# Patient Record
Sex: Female | Born: 1950 | Race: Black or African American | Hispanic: No | Smoking: Never smoker
Health system: Southern US, Community
[De-identification: ages and names within clinical notes are randomized; demographics above are authoritative.]

## PROBLEM LIST (undated history)

## (undated) DIAGNOSIS — I10 Essential (primary) hypertension: Secondary | ICD-10-CM

## (undated) DIAGNOSIS — J45909 Unspecified asthma, uncomplicated: Secondary | ICD-10-CM

## (undated) DIAGNOSIS — E119 Type 2 diabetes mellitus without complications: Secondary | ICD-10-CM

## (undated) DIAGNOSIS — E039 Hypothyroidism, unspecified: Secondary | ICD-10-CM

## (undated) DIAGNOSIS — E785 Hyperlipidemia, unspecified: Secondary | ICD-10-CM

## (undated) DIAGNOSIS — R0600 Dyspnea, unspecified: Secondary | ICD-10-CM

## (undated) HISTORY — PX: TONSILLECTOMY: SUR1361

## (undated) HISTORY — DX: Hypothyroidism, unspecified: E03.9

## (undated) HISTORY — PX: EYE SURGERY: SHX253

## (undated) HISTORY — DX: Type 2 diabetes mellitus without complications: E11.9

## (undated) HISTORY — DX: Essential (primary) hypertension: I10

## (undated) HISTORY — DX: Hyperlipidemia, unspecified: E78.5

## (undated) HISTORY — DX: Unspecified asthma, uncomplicated: J45.909

## (undated) HISTORY — PX: TUBAL LIGATION: SHX77

## (undated) HISTORY — PX: ESOPHAGEAL DILATION: SHX303

---

## 2015-12-03 ENCOUNTER — Other Ambulatory Visit: Payer: Self-pay | Admitting: "Endocrinology

## 2015-12-03 ENCOUNTER — Encounter: Payer: Self-pay | Admitting: "Endocrinology

## 2015-12-03 ENCOUNTER — Ambulatory Visit (INDEPENDENT_AMBULATORY_CARE_PROVIDER_SITE_OTHER): Payer: Medicare Other | Admitting: "Endocrinology

## 2015-12-03 VITALS — BP 139/87 | HR 77 | Ht 65.0 in | Wt 181.0 lb

## 2015-12-03 DIAGNOSIS — E038 Other specified hypothyroidism: Secondary | ICD-10-CM | POA: Insufficient documentation

## 2015-12-03 DIAGNOSIS — M810 Age-related osteoporosis without current pathological fracture: Secondary | ICD-10-CM

## 2015-12-03 DIAGNOSIS — E215 Disorder of parathyroid gland, unspecified: Secondary | ICD-10-CM

## 2015-12-03 DIAGNOSIS — E039 Hypothyroidism, unspecified: Secondary | ICD-10-CM | POA: Insufficient documentation

## 2015-12-03 DIAGNOSIS — E559 Vitamin D deficiency, unspecified: Secondary | ICD-10-CM | POA: Diagnosis not present

## 2015-12-03 NOTE — Progress Notes (Signed)
Subjective:    Patient ID: Priscilla Bowers, female    DOB: [birth year], PCP Andres Shad, MD   Past Medical History  Diagnosis Date  . Diabetes mellitus, type II ([place])   . Hypothyroidism   . Hypertension   . Hyperlipidemia    Past Surgical History  Procedure Laterality Date  . Tonsillectomy    . Tubal ligation     Social History   Social History  . Marital Status: Unknown    Spouse Name: N/A  . Number of Children: N/A  . Years of Education: N/A   Social History Main Topics  . Smoking status: Never Smoker   . Smokeless tobacco: None  . Alcohol Use: No  . Drug Use: No  . Sexual Activity: Not Asked   Other Topics Concern  . None   Social History Narrative  . None   Outpatient Encounter Prescriptions as of 12/03/2015  Medication Sig  . glimepiride (AMARYL) 2 MG tablet Take 2 mg by mouth daily with breakfast.  . levothyroxine (SYNTHROID, LEVOTHROID) 100 MCG tablet Take 100 mcg by mouth daily before breakfast.  . lisinopril (PRINIVIL,ZESTRIL) 10 MG tablet Take 10 mg by mouth daily.  . meclizine (ANTIVERT) 25 MG tablet Take 25 mg by mouth as needed for dizziness.  . metFORMIN (GLUCOPHAGE) 500 MG tablet Take by mouth 2 (two) times daily with a meal.  . Vitamin D, Ergocalciferol, (DRISDOL) 50000 units CAPS capsule Take 50,000 Units by mouth every 7 (seven) days.   No facility-administered encounter medications on file as of 12/03/2015.   ALLERGIES: No Known Allergies VACCINATION STATUS:  There is no immunization history on file for this patient.  HPI 65 year old female patient with medical history as above. She is being seen in consultation for hyperparathyroidism/vitamin D deficiency requested by Dr. Ledell Peoples. Patient is a poor historian. Her documented referral package, she was diagnosed with hyperparathyroidism in the past. This was based on hypercalcemia associated with elevated PTH levels. She did have parathyroid scan which was negative for  parathyroid adenoma on 04/03/2013. Her complete prior workup is not available to review. Her latest lab work from 10/23/2015 showed calcium elevated at 10.9, PTH elevated at 207, phosphorus decreased at 2.0, vitamin D low at 16.8.  She does not recall if 24 hour urine calcium was determined for her. She denies to whoever have bone density determined either. -She has long-standing hypothyroidism and type 2 diabetes on treatment. -She reports hypothyroidism in multiple family members. She denies family history of hypercalcemia requiring parathyroidectomy. -Her dairy intake is average. - She denies history of nephrolithiasis, seizure disorders.  Review of Systems   Constitutional: no weight gain/loss, no fatigue, no subjective hyperthermia/hypothermia Eyes: no blurry vision, no xerophthalmia ENT: no sore throat, no nodules palpated in throat, no dysphagia/odynophagia, no hoarseness Cardiovascular: no CP/SOB/palpitations/leg swelling Respiratory: no cough/SOB Gastrointestinal: no N/V/D/C Musculoskeletal: no muscle/joint aches Skin: no rashes Neurological: no tremors/numbness/tingling/dizziness Psychiatric: no depression/anxiety  Objective:    BP 139/87 mmHg  Pulse 77  Ht 5\' 5"  (1.651 m)  Wt 181 lb (82.101 kg)  BMI 30.12 kg/m2  SpO2 98%  Wt Readings from Last 3 Encounters:  12/03/15 181 lb (82.101 kg)    Physical Exam  Constitutional: overweight, in NAD Eyes: PERRLA, EOMI, no exophthalmos ENT: moist mucous membranes, no thyromegaly, no cervical lymphadenopathy Cardiovascular: RRR, No MRG Respiratory: CTA B Gastrointestinal: abdomen soft, NT, ND, BS+ Musculoskeletal: No gross deformities strength intact in all 4 Skin: moist, warm, no rashes Neurological: no tremor  with outstretched hands, DTR normal in all 4  Accompanying labs from 10/23/2015 showed: Calcium Elevated at 10.9, phosphorus lower at 2.0, PTH elevated at 207, vitamin D  Low at 16.8  -Parathyroid scan from 2014 was  negative for parathyroid adenoma.  Assessment & Plan:   1. Parathyroid abnormality (HCC) -Hypercalcemia associated with elevated parathyroid hormone.  -It is possible that she has primary hyperparathyroidism, however, it is essential to rule out R[place] ( familial hypocalciuric hypercalcemia). -I will proceed to obtain 24-hour urine for calcium determination. If this reveals hypercalcuria, it may support a diagnosis of primary hyperparathyroidism. If she is confirmed to have primary hyperparathyroidism, she will be considered for surgical treatment. -I will also obtain DEXA scan as a baseline study and to screen for osteoporosis. -She will return in 2 weeks with a 4 hour urine calcium and bone density results.  2. Vitamin D deficiency -I noticed that she is on vitamin D 50,000 units weekly, I advised her to continue.  3. Other specified hypothyroidism - TSH on 10/13/2015 was 0.359. I advised her to continue levothyroxine 100 g by mouth every morning.  - We discussed about correct intake of levothyroxine, at fasting, with water, separated by at least 30 minutes from breakfast, and separated by more than 4 hours from calcium, iron, multivitamins, acid reflux medications (PPIs). -Patient is made aware of the fact that thyroid hormone replacement is needed for life, dose to be adjusted by periodic monitoring of thyroid function tests.  4. Type 2 diabetes: Controlled unknown, her referral package does not include A1c. She is on metformin 500 mg by mouth twice a day and Amaryl 2 mg by mouth every morning. She mentions she is following with her primary medical doctor.   - I advised patient to maintain close follow up with Andres Shad, MD for primary care needs. Follow up plan: Return in about 2 weeks (around 12/17/2015) for Bone Density, 24 Hours Urine Calcium.  Glade Lloyd, MD Phone: [phone]  Fax: [phone]   12/03/2015, 11:49 AM

## 2015-12-03 NOTE — Patient Instructions (Signed)
She will get jar from [place], but will do DXA in [place]

## 2015-12-17 ENCOUNTER — Other Ambulatory Visit: Payer: Self-pay | Admitting: "Endocrinology

## 2015-12-17 ENCOUNTER — Ambulatory Visit: Payer: Medicare Other | Admitting: "Endocrinology

## 2015-12-18 LAB — CALCIUM, URINE, 24 HOUR
Calcium, 24 hour urine: 168 mg/24 h (ref 35–250)
Calcium, Ur: 14 mg/dL

## 2015-12-23 ENCOUNTER — Other Ambulatory Visit (HOSPITAL_COMMUNITY): Payer: Self-pay

## 2015-12-28 ENCOUNTER — Ambulatory Visit: Payer: Medicare Other | Admitting: "Endocrinology

## 2016-01-05 ENCOUNTER — Ambulatory Visit: Payer: Medicare Other | Admitting: "Endocrinology

## 2016-01-21 ENCOUNTER — Ambulatory Visit (HOSPITAL_COMMUNITY)
Admission: RE | Admit: 2016-01-21 | Discharge: 2016-01-21 | Disposition: A | Discharge status: Home / Self Care | Payer: Medicare Other | Source: Ambulatory Visit | Attending: "Endocrinology | Admitting: "Endocrinology

## 2016-01-21 ENCOUNTER — Encounter (HOSPITAL_COMMUNITY): Payer: Self-pay | Admitting: Radiology

## 2016-01-21 DIAGNOSIS — M810 Age-related osteoporosis without current pathological fracture: Secondary | ICD-10-CM | POA: Diagnosis not present

## 2016-01-21 DIAGNOSIS — E215 Disorder of parathyroid gland, unspecified: Secondary | ICD-10-CM

## 2016-01-26 ENCOUNTER — Encounter: Payer: Self-pay | Admitting: "Endocrinology

## 2016-01-26 ENCOUNTER — Ambulatory Visit (INDEPENDENT_AMBULATORY_CARE_PROVIDER_SITE_OTHER): Payer: Medicare Other | Admitting: "Endocrinology

## 2016-01-26 VITALS — BP 163/92 | HR 94 | Ht 65.0 in | Wt 180.0 lb

## 2016-01-26 DIAGNOSIS — E215 Disorder of parathyroid gland, unspecified: Secondary | ICD-10-CM

## 2016-01-26 DIAGNOSIS — E559 Vitamin D deficiency, unspecified: Secondary | ICD-10-CM

## 2016-01-26 DIAGNOSIS — M810 Age-related osteoporosis without current pathological fracture: Secondary | ICD-10-CM | POA: Insufficient documentation

## 2016-01-26 DIAGNOSIS — E038 Other specified hypothyroidism: Secondary | ICD-10-CM

## 2016-01-26 MED ORDER — ALENDRONATE SODIUM 70 MG PO TABS: 70.0000 mg | ORAL_TABLET | ORAL | 6 refills | Status: DC

## 2016-01-26 MED ORDER — VITAMIN D (ERGOCALCIFEROL) 1.25 MG (50000 UNIT) PO CAPS: 50000.0000 [IU] | ORAL_CAPSULE | ORAL | 2 refills | Status: DC

## 2016-01-26 NOTE — Progress Notes (Signed)
Subjective:    Patient ID: Priscilla Bowers, female    DOB: [birth year], PCP Andres Shad, MD   Past Medical History:  Diagnosis Date  . Diabetes mellitus, type II ([place])   . Hyperlipidemia   . Hypertension   . Hypothyroidism    Past Surgical History:  Procedure Laterality Date  . TONSILLECTOMY    . TUBAL LIGATION     Social History   Social History  . Marital status: Unknown    Spouse name: N/A  . Number of children: N/A  . Years of education: N/A   Social History Main Topics  . Smoking status: Never Smoker  . Smokeless tobacco: Never Used  . Alcohol use No  . Drug use: No  . Sexual activity: Not Asked   Other Topics Concern  . None   Social History Narrative  . None   Outpatient Encounter Prescriptions as of 01/26/2016  Medication Sig  . alendronate (FOSAMAX) 70 MG tablet Take 1 tablet (70 mg total) by mouth every 7 (seven) days. Take with a full glass of water on an empty stomach.  Marland Kitchen glimepiride (AMARYL) 2 MG tablet Take 2 mg by mouth daily with breakfast.  . levothyroxine (SYNTHROID, LEVOTHROID) 100 MCG tablet Take 100 mcg by mouth daily before breakfast.  . lisinopril (PRINIVIL,ZESTRIL) 10 MG tablet Take 10 mg by mouth daily.  . meclizine (ANTIVERT) 25 MG tablet Take 25 mg by mouth as needed for dizziness.  . metFORMIN (GLUCOPHAGE) 500 MG tablet Take by mouth 2 (two) times daily with a meal.  . Vitamin D, Ergocalciferol, (DRISDOL) 50000 units CAPS capsule Take 1 capsule (50,000 Units total) by mouth every 7 (seven) days.  . [DISCONTINUED] Vitamin D, Ergocalciferol, (DRISDOL) 50000 units CAPS capsule Take 50,000 Units by mouth every 7 (seven) days.   No facility-administered encounter medications on file as of 01/26/2016.    ALLERGIES: No Known Allergies VACCINATION STATUS:  There is no immunization history on file for this patient.  HPI 65 year old female patient with medical history as above. She is being seen in consultation for  hyperparathyroidism/vitamin D deficiency requested by Dr. Ledell Peoples. Patient is a poor historian. Her documented referral package, she was diagnosed with hyperparathyroidism in the past. This was based on hypercalcemia associated with elevated PTH levels. She did have parathyroid scan which was negative for parathyroid adenoma on 04/03/2013. Her complete prior workup is not available to review. Her latest lab work from 10/23/2015 showed calcium elevated at 10.9, PTH elevated at 207, phosphorus decreased at 2.0, vitamin D low at 16.8.  She does not recall if 24 hour urine calcium was determined for her. She denies to whoever have bone density determined either. -She has long-standing hypothyroidism and type 2 diabetes on treatment. -She reports hypothyroidism in multiple family members. She denies family history of hypercalcemia requiring parathyroidectomy. -Her dairy intake is average. - She denies history of nephrolithiasis, seizure disorders.  Review of Systems   Constitutional: no weight gain/loss, no fatigue, no subjective hyperthermia/hypothermia Eyes: no blurry vision, no xerophthalmia ENT: no sore throat, no nodules palpated in throat, no dysphagia/odynophagia, no hoarseness Cardiovascular: no CP/SOB/palpitations/leg swelling Respiratory: no cough/SOB Gastrointestinal: no N/V/D/C Musculoskeletal: no muscle/joint aches Skin: no rashes Neurological: no tremors/numbness/tingling/dizziness Psychiatric: no depression/anxiety  Objective:    BP (!) 163/92   Pulse 94   Ht 5\' 5"  (1.651 m)   Wt 180 lb (81.6 kg)   BMI 29.95 kg/m   Wt Readings from Last 3 Encounters:  01/26/16 180 lb (  81.6 kg)  12/03/15 181 lb (82.1 kg)    Physical Exam  Constitutional: overweight, in NAD Eyes: PERRLA, EOMI, no exophthalmos ENT: moist mucous membranes, no thyromegaly, no cervical lymphadenopathy Cardiovascular: RRR, No MRG Respiratory: CTA B Gastrointestinal: abdomen soft, NT, ND,  BS+ Musculoskeletal: No gross deformities strength intact in all 4 Skin: moist, warm, no rashes Neurological: no tremor with outstretched hands, DTR normal in all 4  Accompanying labs from 10/23/2015 showed: Calcium Elevated at 10.9, phosphorus lower at 2.0, PTH elevated at 207, vitamin D  Low at 16.8  -Parathyroid scan from 2014 was negative for parathyroid adenoma.  Labs since last visit: Recent Results (from the past 2160 hour(s))  Calcium, urine, 24 hour     Status: None   Collection Time: 12/17/15 11:31 AM  Result Value Ref Range   Calcium, Ur 14 Not estab mg/dL   Calcium, 24 hour urine 168 35 - 250 mg/24 h    Comment:                                   Reference Range   35-250                                   Low calcium diet  35-200      Her bone density showed T score -3.3 on spine and -2.6 on bilateral femur  Assessment & Plan:   1. Parathyroid abnormality (HCC) -Hypercalcemia associated with elevated parathyroid hormone.  -It is possible that she has primary hyperparathyroidism, And it is unlikely to be [place] ( familial hypocalciuric hypercalcemia). -Her 24-hour urine calcium  is 168 (normal 35-250 ), considered within normal range. This does not support primary hyperparathyroidism for now.  -Parathyroid scan from 2014 was negative for parathyroid adenoma.   I will continue to correct for potential etiologies for secondary hyperparathyroidism including vitamin D deficiency, see #3 below, and repeat PTH/calcium informant  before subjecting her to surgery.  2. Osteoporosis: -  Her DEXA scan showed significant bone loss on spine and bilateral hips. I discussed treatment options with her and she agrees to get on Fosamax 70 mg by mouth weekly. Precautions and side effects discussed with her.  3. Vitamin D deficiency -I noticed that she is on vitamin D 50,000 units weekly, I advised her to continue. Since her vitamin D was only 16, I gave her refill on this medication.  4.  Other specified hypothyroidism - TSH on 10/13/2015 was 0.359. I advised her to continue levothyroxine 100 g by mouth every morning.  - We discussed about correct intake of levothyroxine, at fasting, with water, separated by at least 30 minutes from breakfast, and separated by more than 4 hours from calcium, iron, multivitamins, acid reflux medications (PPIs). -Patient is made aware of the fact that thyroid hormone replacement is needed for life, dose to be adjusted by periodic monitoring of thyroid function tests.  5. Type 2 diabetes: Control unknown, her referral package does not include A1c. She is on metformin 500 mg by mouth twice a day and Amaryl 2 mg by mouth every morning. She mentions she is following with her primary medical doctor.   - I advised patient to maintain close follow up with Andres Shad, MD for primary care needs. Follow up plan: Return in about 4 months (around 05/27/2016) for follow up with pre-visit labs.  Glade Lloyd, MD Phone: [phone]  Fax: [phone]   01/26/2016, 11:04 AM

## 2016-06-01 ENCOUNTER — Ambulatory Visit: Payer: Medicare Other | Admitting: "Endocrinology

## 2016-06-09 ENCOUNTER — Ambulatory Visit (INDEPENDENT_AMBULATORY_CARE_PROVIDER_SITE_OTHER): Payer: Medicare Other | Admitting: "Endocrinology

## 2016-06-09 ENCOUNTER — Encounter: Payer: Self-pay | Admitting: "Endocrinology

## 2016-06-09 VITALS — BP 104/75 | HR 92 | Ht 65.0 in | Wt 179.0 lb

## 2016-06-09 DIAGNOSIS — E038 Other specified hypothyroidism: Secondary | ICD-10-CM | POA: Diagnosis not present

## 2016-06-09 DIAGNOSIS — E210 Primary hyperparathyroidism: Secondary | ICD-10-CM | POA: Diagnosis not present

## 2016-06-09 DIAGNOSIS — E559 Vitamin D deficiency, unspecified: Secondary | ICD-10-CM | POA: Diagnosis not present

## 2016-06-09 DIAGNOSIS — M810 Age-related osteoporosis without current pathological fracture: Secondary | ICD-10-CM

## 2016-06-09 MED ORDER — MAGNESIUM OXIDE -MG SUPPLEMENT 400 (240 MG) MG PO TABS: 400.0000 mg | ORAL_TABLET | Freq: Two times a day (BID) | ORAL | 0 refills | Status: DC

## 2016-06-09 NOTE — Progress Notes (Signed)
Subjective:    Patient ID: Priscilla Bowers, female    DOB: 10-07-1950, PCP Andres Shad, MD   Past Medical History:  Diagnosis Date  . Diabetes mellitus, type II ([place])   . Hyperlipidemia   . Hypertension   . Hypothyroidism    Past Surgical History:  Procedure Laterality Date  . TONSILLECTOMY    . TUBAL LIGATION     Social History   Social History  . Marital status: Unknown    Spouse name: N/A  . Number of children: N/A  . Years of education: N/A   Social History Main Topics  . Smoking status: Never Smoker  . Smokeless tobacco: Never Used  . Alcohol use No  . Drug use: No  . Sexual activity: Not Asked   Other Topics Concern  . None   Social History Narrative  . None   Outpatient Encounter Prescriptions as of 06/09/2016  Medication Sig  . metoprolol tartrate (LOPRESSOR) 25 MG tablet Take 25 mg by mouth 2 (two) times daily.  Marland Kitchen alendronate (FOSAMAX) 70 MG tablet Take 1 tablet (70 mg total) by mouth every 7 (seven) days. Take with a full glass of water on an empty stomach.  Marland Kitchen glimepiride (AMARYL) 2 MG tablet Take 2 mg by mouth daily with breakfast.  . levothyroxine (SYNTHROID, LEVOTHROID) 125 MCG tablet Take 125 mcg by mouth daily before breakfast.   . Magnesium Oxide 400 (240 Mg) MG TABS Take 400 mg by mouth 2 (two) times daily with a meal.  . meclizine (ANTIVERT) 25 MG tablet Take 25 mg by mouth as needed for dizziness.  . metFORMIN (GLUCOPHAGE) 500 MG tablet Take by mouth 2 (two) times daily with a meal.  . Vitamin D, Ergocalciferol, (DRISDOL) 50000 units CAPS capsule Take 1 capsule (50,000 Units total) by mouth every 7 (seven) days.  . [DISCONTINUED] lisinopril (PRINIVIL,ZESTRIL) 10 MG tablet Take 10 mg by mouth daily.   No facility-administered encounter medications on file as of 06/09/2016.    ALLERGIES: No Known Allergies VACCINATION STATUS:  There is no immunization history on file for this patient.  HPI 65 year old female patient with medical  history as above. She is being seen in f/u for hyperparathyroidism/vitamin D deficiency requested by Dr. Ledell Peoples. Patient is a poor historian. Per documented referral package, she was diagnosed with hyperparathyroidism in the past. This was based on hypercalcemia associated with elevated PTH levels. She did have parathyroid scan which was negative for parathyroid adenoma on 04/03/2013. Her complete prior workup is not available to review. Her  lab work from 10/23/2015 showed calcium elevated at 10.9, PTH elevated at 207, phosphorus decreased at 2.0, vitamin D low at 16.8. - Her latest labs from 06/01/2016 showed intact PTH remaining high at 229 associated with hypercalcemia of 10.4. She was also found to have hypomagnesemia of 1.6. She has normal renal function.  Prior to her last visit, 24 hour urine calcium was within normal range at 168. -She has long-standing hypothyroidism on levothyroxine 125 g by mouth every morning and type 2 diabetes on treatment with metformin and Amaryl. -She reports hypothyroidism in multiple family members. She denies family history of hypercalcemia requiring parathyroidectomy. -Her dairy intake is average. - She denies history of nephrolithiasis, seizure disorders. - She has no new complaints today. She has tolerated alendronate given for osteoporosis.  Review of Systems   Constitutional: no weight gain/loss, no fatigue, no subjective hyperthermia/hypothermia Eyes: no blurry vision, no xerophthalmia ENT: no sore throat, no nodules palpated in  throat, no dysphagia/odynophagia, no hoarseness Cardiovascular: no CP/SOB/palpitations/leg swelling Respiratory: no cough/SOB Gastrointestinal: no N/V/D/C Musculoskeletal: no muscle/joint aches Skin: no rashes Neurological: no tremors/numbness/tingling/dizziness Psychiatric: no depression/anxiety  Objective:    BP 104/75   Pulse 92   Ht 5\' 5"  (1.651 m)   Wt 179 lb (81.2 kg)   BMI 29.79 kg/m   Wt Readings  from Last 3 Encounters:  06/09/16 179 lb (81.2 kg)  01/26/16 180 lb (81.6 kg)  12/03/15 181 lb (82.1 kg)    Physical Exam  Constitutional: overweight, in NAD Eyes: PERRLA, EOMI, no exophthalmos ENT: moist mucous membranes, no thyromegaly, no cervical lymphadenopathy Cardiovascular: RRR, No MRG Respiratory: CTA B Gastrointestinal: abdomen soft, NT, ND, BS+ Musculoskeletal: No gross deformities strength intact in all 4 Skin: moist, warm, no rashes Neurological: no tremor with outstretched hands, DTR normal in all 4  - Labs from 06/01/2016 showed calcium elevated at 10.4, intact PTH remains high at 229, magnesium low at 1.6, normal renal function. X9 TSH 1.73, free T4 1.11  labs from 10/23/2015 showed: Calcium Elevated at 10.9, phosphorus lower at 2.0, PTH elevated at 207, vitamin D  Low at 16.8  -Parathyroid scan from 2014 was negative for parathyroid adenoma.  Her bone density showed T score -3.3 on spine and -2.6 on bilateral femur.    Assessment & Plan:   1. Hyperparathyroidism, primary: -Hypercalcemia associated with elevated parathyroid hormone-worsening to 229.  -It is likely that she has primary hyperparathyroidism, And it is unlikely to be [place] ( familial hypocalciuric hypercalcemia). - Prior to her last visit, her 24-hour urine calcium  was 168 (normal 35-250 ), considered within normal range.  -Parathyroid scan from 2014 was negative for parathyroid adenoma.  - She is on vitamin D supplement to correct profound vitamin D deficiency. - She will now need magnesium supplement for hypomagnesemia of 1.6.  I will proceed to repeat 24-hour urine calcium. She has complications including osteoporosis. - She will be referred to Dr. Armandina Gemma for parathyroidectomy.  2. Osteoporosis: -  Her DEXA scan showed significant bone loss on spine and bilateral hips. I discussed treatment options with her and she agrees to continue  on Fosamax 70 mg by mouth weekly. Precautions and side  effects discussed with her.  3. Vitamin D deficiency  - she is on vitamin D 50,000 units weekly, I advised her to continue. Since her vitamin D was only 16, she would benefit from vitamin D treatment .  4.  hypothyroidism -  Her thyroid function tests are consistent with appropriate replacement. I advised her to continue levothyroxine 100 g by mouth every morning.  - We discussed about correct intake of levothyroxine, at fasting, with water, separated by at least 30 minutes from breakfast, and separated by more than 4 hours from calcium, iron, multivitamins, acid reflux medications (PPIs). -Patient is made aware of the fact that thyroid hormone replacement is needed for life, dose to be adjusted by periodic monitoring of thyroid function tests.  5. Type 2 diabetes: Control unknown, her referral package does not include A1c. She is on metformin 500 mg by mouth twice a day and Amaryl 2 mg by mouth every morning. She mentions she is following with her primary medical doctor.   - I advised patient to maintain close follow up with Andres Shad, MD for primary care needs. Follow up plan: Return in about 2 weeks (around 06/23/2016) for 24 Hours Urine Calcium.  Glade Lloyd, MD Phone: [phone]  Fax: [phone]   06/09/2016,  11:14 AM

## 2016-06-16 ENCOUNTER — Other Ambulatory Visit: Payer: Self-pay | Admitting: "Endocrinology

## 2016-06-17 LAB — CALCIUM, URINE, 24 HOUR
CALCIUM 24HR UR: 117 mg/(24.h) (ref 35–250)
CALCIUM UR: 9 mg/dL

## 2016-07-01 ENCOUNTER — Ambulatory Visit: Payer: Medicare Other | Admitting: "Endocrinology

## 2016-07-18 ENCOUNTER — Ambulatory Visit (INDEPENDENT_AMBULATORY_CARE_PROVIDER_SITE_OTHER): Payer: Medicare Other | Admitting: "Endocrinology

## 2016-07-18 ENCOUNTER — Encounter: Payer: Self-pay | Admitting: "Endocrinology

## 2016-07-18 VITALS — BP 147/84 | HR 92 | Ht 65.0 in | Wt 183.0 lb

## 2016-07-18 DIAGNOSIS — M810 Age-related osteoporosis without current pathological fracture: Secondary | ICD-10-CM | POA: Diagnosis not present

## 2016-07-18 DIAGNOSIS — E210 Primary hyperparathyroidism: Secondary | ICD-10-CM | POA: Diagnosis not present

## 2016-07-18 DIAGNOSIS — E038 Other specified hypothyroidism: Secondary | ICD-10-CM

## 2016-07-18 DIAGNOSIS — E559 Vitamin D deficiency, unspecified: Secondary | ICD-10-CM | POA: Diagnosis not present

## 2016-07-18 MED ORDER — MAGNESIUM OXIDE -MG SUPPLEMENT 400 (240 MG) MG PO TABS: 400.0000 mg | ORAL_TABLET | Freq: Two times a day (BID) | ORAL | 0 refills | Status: DC

## 2016-07-18 NOTE — Progress Notes (Signed)
Subjective:    Patient ID: Priscilla Bowers, female    DOB: 01-Sep-1950, PCP Andres Shad, MD   Past Medical History:  Diagnosis Date  . Diabetes mellitus, type II ([place])   . Hyperlipidemia   . Hypertension   . Hypothyroidism    Past Surgical History:  Procedure Laterality Date  . TONSILLECTOMY    . TUBAL LIGATION     Social History   Social History  . Marital status: Unknown    Spouse name: N/A  . Number of children: N/A  . Years of education: N/A   Social History Main Topics  . Smoking status: Never Smoker  . Smokeless tobacco: Never Used  . Alcohol use No  . Drug use: No  . Sexual activity: Not on file   Other Topics Concern  . Not on file   Social History Narrative  . No narrative on file   Outpatient Encounter Prescriptions as of 07/18/2016  Medication Sig  . alendronate (FOSAMAX) 70 MG tablet Take 1 tablet (70 mg total) by mouth every 7 (seven) days. Take with a full glass of water on an empty stomach.  Marland Kitchen glimepiride (AMARYL) 2 MG tablet Take 2 mg by mouth daily with breakfast.  . levothyroxine (SYNTHROID, LEVOTHROID) 125 MCG tablet Take 125 mcg by mouth daily before breakfast.   . Magnesium Oxide 400 (240 Mg) MG TABS Take 1 tablet (400 mg total) by mouth 2 (two) times daily with a meal.  . meclizine (ANTIVERT) 25 MG tablet Take 25 mg by mouth as needed for dizziness.  . metFORMIN (GLUCOPHAGE) 500 MG tablet Take by mouth 2 (two) times daily with a meal.  . metoprolol tartrate (LOPRESSOR) 25 MG tablet Take 25 mg by mouth 2 (two) times daily.  . Vitamin D, Ergocalciferol, (DRISDOL) 50000 units CAPS capsule Take 1 capsule (50,000 Units total) by mouth every 7 (seven) days.  . [DISCONTINUED] Magnesium Oxide 400 (240 Mg) MG TABS Take 400 mg by mouth 2 (two) times daily with a meal.   No facility-administered encounter medications on file as of 07/18/2016.    ALLERGIES: No Known Allergies VACCINATION STATUS:  There is no immunization history on file for  this patient.  HPI 66 year old female patient with medical history as above. She is being seen in f/u for hyperparathyroidism/vitamin D deficiency requested by Dr. Ledell Peoples. Patient is a poor historian. Per documented referral package, she was diagnosed with hyperparathyroidism in the past. This was based on hypercalcemia associated with elevated PTH levels. She did have parathyroid scan which was negative for parathyroid adenoma on 04/03/2013. Her complete prior workup is not available to review. Her  lab work from 10/23/2015 showed calcium elevated at 10.9, PTH elevated at 207, phosphorus decreased at 2.0, vitamin D low at 16.8. - Her latest labs from 06/01/2016 showed intact PTH remaining high at 229 associated with hypercalcemia of 10.4. She was also found to have hypomagnesemia of 1.6. She has normal renal function.  Prior to her last visit, 24 hour urine calcium was within normal range at 168. -She has long-standing hypothyroidism on levothyroxine 125 g by mouth every morning and type 2 diabetes on treatment with metformin and Amaryl. -She reports hypothyroidism in multiple family members. She denies family history of hypercalcemia requiring parathyroidectomy. -Her dairy intake is average. - She denies history of nephrolithiasis, seizure disorders. - She has no new complaints today. She has tolerated alendronate given for osteoporosis.  Review of Systems   Constitutional: no weight gain/loss, no fatigue,  no subjective hyperthermia/hypothermia Eyes: no blurry vision, no xerophthalmia ENT: no sore throat, no nodules palpated in throat, no dysphagia/odynophagia, no hoarseness Cardiovascular: no CP/SOB/palpitations/leg swelling Respiratory: no cough/SOB Gastrointestinal: no N/V/D/C Musculoskeletal: no muscle/joint aches Skin: no rashes Neurological: no tremors/numbness/tingling/dizziness Psychiatric: no depression/anxiety  Objective:    BP (!) 147/84   Pulse 92   Ht 5\' 5"   (1.651 m)   Wt 183 lb (83 kg)   BMI 30.45 kg/m   Wt Readings from Last 3 Encounters:  07/18/16 183 lb (83 kg)  06/09/16 179 lb (81.2 kg)  01/26/16 180 lb (81.6 kg)    Physical Exam  Constitutional: overweight, in NAD Eyes: PERRLA, EOMI, no exophthalmos ENT: moist mucous membranes, no thyromegaly, no cervical lymphadenopathy Cardiovascular: RRR, No MRG Respiratory: CTA B Gastrointestinal: abdomen soft, NT, ND, BS+ Musculoskeletal: No gross deformities strength intact in all 4 Skin: moist, warm, no rashes Neurological: no tremor with outstretched hands, DTR normal in all 4  - Labs from 06/01/2016 showed calcium elevated at 10.4, intact PTH remains high at 229, magnesium low at 1.6, normal renal function.   TSH 1.73, free T4 1.11  labs from 10/23/2015 showed: Calcium Elevated at 10.9, phosphorus lower at 2.0, PTH elevated at 207, vitamin D  Low at 16.8  -Parathyroid scan from 2014 was negative for parathyroid adenoma.  Her bone density showed T score -3.3 on spine and -2.6 on bilateral femur. - 24 hour urine calcium repeated on 06/16/2016 was 117 improving from 168.    Assessment & Plan:   1. Hyperparathyroidism, primary: -Hypercalcemia associated with elevated parathyroid hormone-worsening to 229 With negative parathyroid scan. -It is likely that she has primary hyperparathyroidism, And it is unlikely to be [place] ( familial hypocalciuric hypercalcemia). - Prior to her last visit, her 24-hour urine calcium  was 168 (normal 35-250 ), considered within normal range. - Repeat of 24-hour urine calcium on 06/16/2016 was 117.  -Parathyroid scan from 2014 was negative for parathyroid adenoma.  - She is on vitamin D supplement to correct profound vitamin D deficiency. - She will now need magnesium supplement for hypomagnesemia of 1.6. She has complications including osteoporosis. - I will hold off on referral to surgery for now. She will have repeat PTH/calcium, magnesium, phosphorus  in 4 months.  2. Osteoporosis: -  Her DEXA scan showed significant bone loss on spine and bilateral hips. I discussed treatment options with her and she agrees to continue  on Fosamax 70 mg by mouth weekly. Precautions and side effects discussed with her.  3. Vitamin D deficiency  - she is on vitamin D 50,000 units weekly, I advised her to continue. Since her vitamin D was only 16, she would benefit from vitamin D treatment .  4.  hypothyroidism -  Her thyroid function tests are consistent with appropriate replacement. I advised her to continue levothyroxine 100 g by mouth every morning.  - We discussed about correct intake of levothyroxine, at fasting, with water, separated by at least 30 minutes from breakfast, and separated by more than 4 hours from calcium, iron, multivitamins, acid reflux medications (PPIs). -Patient is made aware of the fact that thyroid hormone replacement is needed for life, dose to be adjusted by periodic monitoring of thyroid function tests.  5. Type 2 diabetes: Control unknown, her referral package does not include A1c. She is on metformin 500 mg by mouth twice a day and Amaryl 2 mg by mouth every morning. She mentions she is following with her primary medical doctor.   -  I advised patient to maintain close follow up with Andres Shad, MD for primary care needs. Follow up plan: Return in about 4 months (around 11/15/2016) for follow up with pre-visit labs.  Glade Lloyd, MD Phone: [phone]  Fax: [phone]   07/18/2016, 11:24 AM

## 2016-08-11 ENCOUNTER — Other Ambulatory Visit: Payer: Self-pay | Admitting: "Endocrinology

## 2016-08-26 ENCOUNTER — Other Ambulatory Visit: Payer: Self-pay | Admitting: "Endocrinology

## 2016-11-16 ENCOUNTER — Encounter: Payer: Self-pay | Admitting: "Endocrinology

## 2016-11-16 ENCOUNTER — Ambulatory Visit (INDEPENDENT_AMBULATORY_CARE_PROVIDER_SITE_OTHER): Payer: Medicare Other | Admitting: "Endocrinology

## 2016-11-16 VITALS — BP 148/81 | HR 92 | Ht 65.0 in | Wt 183.0 lb

## 2016-11-16 DIAGNOSIS — E118 Type 2 diabetes mellitus with unspecified complications: Secondary | ICD-10-CM

## 2016-11-16 DIAGNOSIS — E038 Other specified hypothyroidism: Secondary | ICD-10-CM

## 2016-11-16 DIAGNOSIS — M810 Age-related osteoporosis without current pathological fracture: Secondary | ICD-10-CM | POA: Diagnosis not present

## 2016-11-16 DIAGNOSIS — E559 Vitamin D deficiency, unspecified: Secondary | ICD-10-CM

## 2016-11-16 DIAGNOSIS — E210 Primary hyperparathyroidism: Secondary | ICD-10-CM | POA: Diagnosis not present

## 2016-11-16 MED ORDER — LEVOTHYROXINE SODIUM 125 MCG PO TABS: 125.0000 ug | ORAL_TABLET | Freq: Every day | ORAL | 6 refills | Status: DC

## 2016-11-16 MED ORDER — MAGNESIUM OXIDE -MG SUPPLEMENT 400 (240 MG) MG PO TABS: 400.0000 mg | ORAL_TABLET | Freq: Two times a day (BID) | ORAL | 1 refills | Status: DC

## 2016-11-16 NOTE — Progress Notes (Signed)
Subjective:    Patient ID: Priscilla Bowers, female    DOB: 1950/10/29, PCP Andres Shad, MD   Past Medical History:  Diagnosis Date  . Diabetes mellitus, type II ([place])   . Hyperlipidemia   . Hypertension   . Hypothyroidism    Past Surgical History:  Procedure Laterality Date  . TONSILLECTOMY    . TUBAL LIGATION     Social History   Social History  . Marital status: Unknown    Spouse name: N/A  . Number of children: N/A  . Years of education: N/A   Social History Main Topics  . Smoking status: Never Smoker  . Smokeless tobacco: Never Used  . Alcohol use No  . Drug use: No  . Sexual activity: Not Asked   Other Topics Concern  . None   Social History Narrative  . None   Outpatient Encounter Prescriptions as of 11/16/2016  Medication Sig  . losartan (COZAAR) 25 MG tablet Take 25 mg by mouth daily.  Marland Kitchen alendronate (FOSAMAX) 70 MG tablet Take 1 tablet by mouth every week take with a full glass of water on an empty stomach  . glimepiride (AMARYL) 2 MG tablet Take 2 mg by mouth daily with breakfast.  . levothyroxine (SYNTHROID, LEVOTHROID) 125 MCG tablet Take 1 tablet (125 mcg total) by mouth daily before breakfast.  . Magnesium Oxide 400 (240 Mg) MG TABS Take 1 tablet (400 mg total) by mouth 2 (two) times daily with a meal.  . meclizine (ANTIVERT) 25 MG tablet Take 25 mg by mouth as needed for dizziness.  . metFORMIN (GLUCOPHAGE) 500 MG tablet Take by mouth 2 (two) times daily with a meal.  . metoprolol tartrate (LOPRESSOR) 25 MG tablet Take 25 mg by mouth 2 (two) times daily.  . Vitamin D, Ergocalciferol, (DRISDOL) 50000 units CAPS capsule Take 1 capsule (50,000 Units total) by mouth every 7 (seven) days.  . [DISCONTINUED] levothyroxine (SYNTHROID, LEVOTHROID) 125 MCG tablet Take 125 mcg by mouth daily before breakfast.   . [DISCONTINUED] Magnesium Oxide 400 (240 Mg) MG TABS Take 1 tablet (400 mg total) by mouth 2 (two) times daily with a meal.   No  facility-administered encounter medications on file as of 11/16/2016.    ALLERGIES: No Known Allergies VACCINATION STATUS:  There is no immunization history on file for this patient.  HPI 66 year old female patient with medical history as above. She is being seen in f/u for hyperparathyroidism/vitamin D deficiency requested by Dr. Ledell Peoples. Patient is a poor historian. Per documented referral package, she was diagnosed with hyperparathyroidism in the past. This was based on hypercalcemia associated with elevated PTH levels. She did have parathyroid scan which was negative for parathyroid adenoma on 04/03/2013.  Her  lab work from 10/23/2015 showed calcium elevated at 10.9, PTH elevated at 207, phosphorus decreased at 2.0, vitamin D low at 16.8. - Her latest labs from 06/01/2016 showed intact PTH remaining high at 229 associated with hypercalcemia of 10.4. She was also found to have hypomagnesemia of 1.6. She has normal renal function.  Prior to her last visit, 24 hour urine calcium was within normal range at 168. - On her latest labs on 11/04/2016, PTH was improved to 85 associated with calcium improved to 10.6, phosphorus 2.0, magnesium still low at 1.5, free T4 0.97, TSH 2.19.  -She has long-standing hypothyroidism on levothyroxine 125 g by mouth every morning and type 2 diabetes on treatment with metformin and Amaryl. -She reports hypothyroidism in multiple  family members. She denies family history of hypercalcemia requiring parathyroidectomy. -Her daily dairy intake is average. - She denies history of nephrolithiasis, seizure disorders. - She has no new complaints today. She has tolerated alendronate given for osteoporosis.  Review of Systems   Constitutional: no weight gain/loss, no fatigue, no subjective hyperthermia/hypothermia Eyes: no blurry vision, no xerophthalmia ENT: no sore throat, no nodules palpated in throat, no dysphagia/odynophagia, no hoarseness Cardiovascular:  no CP/SOB/palpitations/leg swelling Respiratory: no cough/SOB Gastrointestinal: no N/V/D/C Musculoskeletal: no muscle/joint aches Skin: no rashes Neurological: no tremors/numbness/tingling/dizziness Psychiatric: no depression/anxiety  Objective:    BP (!) 148/81   Pulse 92   Ht 5\' 5"  (1.651 m)   Wt 183 lb (83 kg)   BMI 30.45 kg/m   Wt Readings from Last 3 Encounters:  11/16/16 183 lb (83 kg)  07/18/16 183 lb (83 kg)  06/09/16 179 lb (81.2 kg)    Physical Exam  Constitutional: overweight, in NAD Eyes: PERRLA, EOMI, no exophthalmos ENT: moist mucous membranes, no thyromegaly, no cervical lymphadenopathy Cardiovascular: RRR, No MRG Respiratory: CTA B Gastrointestinal: abdomen soft, NT, ND, BS+ Musculoskeletal: No gross deformities strength intact in all 4 Skin: moist, warm, no rashes Neurological: no tremor with outstretched hands, DTR normal in all 4  - On her latest labs on 11/04/2016, PTH was improved to 85 associated with calcium improved to 10.6, phosphorus 2.0, magnesium still low at 1.5, free T4 0.97, TSH 2.19.  - Labs from 06/01/2016 showed calcium elevated at 10.4, intact PTH remains high at 229, magnesium low at 1.6, normal renal function.   TSH 1.73, free T4 1.11  labs from 10/23/2015 showed: Calcium Elevated at 10.9, phosphorus lower at 2.0, PTH elevated at 207, vitamin D  Low at 16.8  -Parathyroid scan from 2014 was negative for parathyroid adenoma.  Her bone density showed T score -3.3 on spine and -2.6 on bilateral femur. - 24 hour urine calcium repeated on 06/16/2016 was 117 improving from 168.    Assessment & Plan:   1. Hyperparathyroidism, primary: -Hypercalcemia  Is improving to 10.6 associated with improving PTH of 85 from 229.  She has had negative parathyroid scan. -It is likely that she has primary hyperparathyroidism, And it is unlikely to be [place] ( familial hypocalciuric hypercalcemia). - Prior to her last visit, her 24-hour urine calcium  was  168 (normal 35-250 ), considered within normal range. - Repeat of 24-hour urine calcium on 06/16/2016 was 117.  -Parathyroid scan from 2014 was negative for parathyroid adenoma.  - She is on vitamin D supplement to correct profound vitamin D deficiency. - I will hold off on referral to surgery for now. She will have repeat PTH/calcium, magnesium in 6 months.  2. Hypomagnesemia : She will continue to need magnesium supplement for hypomagnesemia of 1.5, which did not correct from 1.6 last visit.  She has complications including osteoporosis. 3. Osteoporosis: -  Her DEXA   Scan FROM AUGUST 2017 showed significant bone loss on spine and bilateral hips. I discussed treatment options with her and she agrees to continue  on Fosamax 70 mg by mouth weekly. Precautions and side effects discussed with her.  4. Vitamin D deficiency  - she is on vitamin D 50,000 units weekly, I advised her to continue. Since her vitamin D was only 16, she would benefit from continued  vitamin D supplement  .  5.  hypothyroidism -  Her thyroid function tests are consistent with appropriate replacement. I advised her to continue levothyroxine 100 g  by mouth every morning.  - We discussed about correct intake of levothyroxine, at fasting, with water, separated by at least 30 minutes from breakfast, and separated by more than 4 hours from calcium, iron, multivitamins, acid reflux medications (PPIs). -Patient is made aware of the fact that thyroid hormone replacement is needed for life, dose to be adjusted by periodic monitoring of thyroid function tests.  6. Type 2 diabetes: Control unknown, her referral package does not include A1c. She is on metformin 500 mg by mouth twice a day and Amaryl 2 mg by mouth every morning. She mentions she is following with her primary medical doctor.   - I advised patient to maintain close follow up with Andres Shad, MD for primary care needs. Follow up plan: Return in about 6  months (around 05/19/2017) for follow up with pre-visit labs.  Glade Lloyd, MD Phone: [phone]  Fax: [phone]   11/16/2016, 1:24 PM

## 2017-02-28 ENCOUNTER — Telehealth: Payer: Self-pay | Admitting: "Endocrinology

## 2017-02-28 MED ORDER — ALENDRONATE SODIUM 70 MG PO TABS: ORAL_TABLET | ORAL | 5 refills | Status: DC

## 2017-02-28 NOTE — Telephone Encounter (Signed)
Priscilla Bowers is asking for a refill on alendronate (FOSAMAX) 70 MG tablet please advise?

## 2017-02-28 NOTE — Telephone Encounter (Signed)
Rx sent 

## 2017-05-19 ENCOUNTER — Ambulatory Visit: Payer: Medicare Other | Admitting: "Endocrinology

## 2017-06-01 ENCOUNTER — Other Ambulatory Visit: Payer: Self-pay | Admitting: "Endocrinology

## 2017-06-02 MED ORDER — LEVOTHYROXINE SODIUM 100 MCG PO TABS: 100.0000 ug | ORAL_TABLET | Freq: Every day | ORAL | 3 refills | Status: DC

## 2017-06-08 ENCOUNTER — Encounter: Payer: Self-pay | Admitting: "Endocrinology

## 2017-06-08 LAB — HEMOGLOBIN A1C: Hemoglobin A1C: 9.4

## 2017-06-08 LAB — TSH: TSH: 2.41 (ref 0.41–5.90)

## 2017-06-08 LAB — BASIC METABOLIC PANEL
BUN: 12 (ref 4–21)
Creatinine: 0.8 (ref <=1.1)

## 2017-06-09 ENCOUNTER — Encounter: Payer: Self-pay | Admitting: "Endocrinology

## 2017-06-09 ENCOUNTER — Ambulatory Visit (INDEPENDENT_AMBULATORY_CARE_PROVIDER_SITE_OTHER): Payer: Medicare Other | Admitting: "Endocrinology

## 2017-06-09 VITALS — BP 149/91 | HR 103 | Ht 65.0 in | Wt 181.0 lb

## 2017-06-09 DIAGNOSIS — M810 Age-related osteoporosis without current pathological fracture: Secondary | ICD-10-CM

## 2017-06-09 DIAGNOSIS — E210 Primary hyperparathyroidism: Secondary | ICD-10-CM

## 2017-06-09 DIAGNOSIS — E559 Vitamin D deficiency, unspecified: Secondary | ICD-10-CM

## 2017-06-09 DIAGNOSIS — Z794 Long term (current) use of insulin: Secondary | ICD-10-CM | POA: Insufficient documentation

## 2017-06-09 DIAGNOSIS — E038 Other specified hypothyroidism: Secondary | ICD-10-CM | POA: Diagnosis not present

## 2017-06-09 DIAGNOSIS — E118 Type 2 diabetes mellitus with unspecified complications: Secondary | ICD-10-CM | POA: Diagnosis not present

## 2017-06-09 DIAGNOSIS — E1165 Type 2 diabetes mellitus with hyperglycemia: Secondary | ICD-10-CM | POA: Insufficient documentation

## 2017-06-09 MED ORDER — GLUCOSE BLOOD VI STRP: ORAL_STRIP | 2 refills | Status: AC

## 2017-06-09 MED ORDER — ACCU-CHEK GUIDE W/DEVICE KIT: 1.0000 | PACK | 0 refills | Status: AC

## 2017-06-09 NOTE — Progress Notes (Signed)
Subjective:    Patient ID: Priscilla Bowers, female    DOB: 07-28-1950, PCP Andres Shad, MD   Past Medical History:  Diagnosis Date  . Diabetes mellitus, type II ([place])   . Hyperlipidemia   . Hypertension   . Hypothyroidism    Past Surgical History:  Procedure Laterality Date  . TONSILLECTOMY    . TUBAL LIGATION     Social History   Socioeconomic History  . Marital status: Unknown    Spouse name: None  . Number of children: None  . Years of education: None  . Highest education level: None  Social Needs  . Financial resource strain: None  . Food insecurity - worry: None  . Food insecurity - inability: None  . Transportation needs - medical: None  . Transportation needs - non-medical: None  Occupational History  . None  Tobacco Use  . Smoking status: Never Smoker  . Smokeless tobacco: Never Used  Substance and Sexual Activity  . Alcohol use: No  . Drug use: No  . Sexual activity: None  Other Topics Concern  . None  Social History Narrative  . None   Outpatient Encounter Medications as of 06/09/2017  Medication Sig  . amLODipine (NORVASC) 5 MG tablet Take 5 mg by mouth daily.  Marland Kitchen alendronate (FOSAMAX) 70 MG tablet Take 1 tablet by mouth every week take with a full glass of water on an empty stomach  . Blood Glucose Monitoring Suppl (ACCU-CHEK GUIDE) w/Device KIT 1 Piece by Does not apply route as directed.  Marland Kitchen glimepiride (AMARYL) 2 MG tablet Take 2 mg by mouth daily with breakfast.  . glucose blood (ACCU-CHEK GUIDE) test strip Use as instructed  . levothyroxine (SYNTHROID, LEVOTHROID) 100 MCG tablet Take 1 tablet (100 mcg total) by mouth daily before breakfast.  . losartan (COZAAR) 25 MG tablet Take 25 mg by mouth daily.  . Magnesium Oxide 400 (240 Mg) MG TABS Take 1 tablet (400 mg total) by mouth 2 (two) times daily with a meal.  . meclizine (ANTIVERT) 25 MG tablet Take 25 mg by mouth as needed for dizziness.  . metFORMIN (GLUCOPHAGE) 500 MG tablet Take  by mouth 2 (two) times daily with a meal.  . metoprolol tartrate (LOPRESSOR) 25 MG tablet Take 25 mg by mouth 2 (two) times daily.  . Vitamin D, Ergocalciferol, (DRISDOL) 50000 units CAPS capsule Take 1 capsule (50,000 Units total) by mouth every 7 (seven) days.   No facility-administered encounter medications on file as of 06/09/2017.    ALLERGIES: No Known Allergies VACCINATION STATUS:  There is no immunization history on file for this patient.  HPI 66 year old female patient with medical history as above. She is being seen in f/u for hyperparathyroidism/vitamin D deficiency requested by Dr. Ledell Peoples. - She is on observation for primary hyperparathyroidism while treating for osteoporosis, vitamin D deficiency, and hypomagnesemia. Patient is a poor historian. Per documented referral package, she was diagnosed with hyperparathyroidism in the past. This was based on hypercalcemia associated with elevated PTH levels. She did have parathyroid scan which was negative for parathyroid adenoma on 04/03/2013.  Her  lab work from 10/23/2015 showed calcium elevated at 10.9, PTH elevated at 207, phosphorus decreased at 2.0, vitamin D low at 16.8. - Her  labs from 06/01/2016 showed intact PTH remaining high at 229 associated with hypercalcemia of 10.4. She was also found to have hypomagnesemia of 1.6. She has normal renal function.  Prior to her last visit, 24 hour urine  calcium was within normal range at 168. - On other labs on 11/04/2016, PTH was improved to 85 associated with calcium improved to 10.6, phosphorus 2.0, magnesium still low at 1.5, free T4 0.97, TSH 2.19. - Repeat labs before this visit on 06/08/2017 showed PTH backup at 138 associated with high calcium of 10.6. - She has significant cognitive deficit, could not confirm to me if she has been consistent on her magnesium supplement and her labs did not include magnesium and phosphorus lab tests ordered. She denies family history of  hypercalcemia requiring parathyroidectomy. -Her daily dairy intake is average. - She denies history of nephrolithiasis, seizure disorders, however her significant osteoporosis on treatment with alendronate 70 mg by mouth weekly. She is tolerating this medication very well.  -She has long-standing hypothyroidism on levothyroxine 125 g by mouth every morning. She reports hypothyroidism in multiple family members. - She also have chronically uncontrolled type 2 diabetes on treatment with metformin and Amaryl. She does  not monitor blood glucose regularly. Her A1c is high at 9.4%.  - She has no new complaints today.   Review of Systems   Constitutional: + stable weight , no fatigue, no subjective hyperthermia/hypothermia Eyes: no blurry vision, no xerophthalmia ENT: no sore throat, no nodules palpated in throat, no dysphagia/odynophagia, no hoarseness Cardiovascular: no CP/SOB/palpitations/leg swelling Respiratory: no cough/SOB Gastrointestinal: no N/V/D/C Musculoskeletal: no muscle/joint aches Skin: no rashes Neurological: no tremors/numbness/tingling/dizziness Psychiatric: no depression/anxiety  Objective:    BP (!) 149/91   Pulse (!) 103   Ht 5' 5"  (1.651 m)   Wt 181 lb (82.1 kg)   BMI 30.12 kg/m   Wt Readings from Last 3 Encounters:  06/09/17 181 lb (82.1 kg)  11/16/16 183 lb (83 kg)  07/18/16 183 lb (83 kg)    Physical Exam  Constitutional: overweight, in NAD Eyes: PERRLA, EOMI, no exophthalmos ENT: moist mucous membranes, no thyromegaly, no cervical lymphadenopathy Cardiovascular: RRR, No MRG Respiratory: CTA B Gastrointestinal: abdomen soft, NT, ND, BS+ Musculoskeletal: No gross deformities strength intact in all 4 Skin: moist, warm, no rashes Neurological: no tremor with outstretched hands, DTR normal in all 4  Labs from the 06/08/2017: PTH 138, serum 10.6, TSH 2.41, free T4 0.98, A1c 9.4, BUN 12, creatinine 0.77.  - On her labs on 11/04/2016, PTH was 85  associated with calcium improved to 10.6, phosphorus 2.0, magnesium still low at 1.5, free T4 0.97, TSH 2.19.  - Labs from 06/01/2016 showed calcium at 10.4, intact PTH remains high at 229, magnesium low at 1.6, normal renal function.   TSH 1.73, free T4 1.11  labs from 10/23/2015 showed: Calcium Elevated at 10.9, phosphorus lower at 2.0, PTH elevated at 207, vitamin D  Low at 16.8  -Parathyroid scan from 2014 was negative for parathyroid adenoma.  Her bone density showed T score -3.3 on spine and -2.6 on bilateral femur. - 24 hour urine calcium repeated on 06/16/2016 was 117 improving from 168.    Assessment & Plan:   1. Hyperparathyroidism, primary: -Hypercalcemia  is stable at 10.6, generally improving from 10.9.  - She has fluctuating PTH level currently higher at 138 from 85 last visit although dropping from overall 229.  She has had negative parathyroid scan. -It is likely that she has primary hyperparathyroidism, and it is unlikely to be [place] ( familial hypocalciuric hypercalcemia). - Prior to her last visit, her 24-hour urine calcium  was 168 (normal 35-250 ), considered within normal range. - Repeat of 24-hour urine calcium on 06/16/2016  was 117.  -Parathyroid scan from 2014 was negative for parathyroid adenoma.  - She is on vitamin D supplement to correct profound vitamin D deficiency. - She is relatively young, has complications including osteoporosis, unreliable for follow-up in other conservative therapy due to cognitive deficit, she may require surgical exploration.  - I will send her to lab for magnesium, phosphorus and repeat 24-hour urine calcium measurement.  2. Hypomagnesemia :  her last visit magnesium was low at 1.5, which did not correct from 1.6  from prior visit.  Hypomagnesemia can lead to parathyroid dysfunction, hence, correction is mandatory.   She'll be considered for continued magnesium supplement if she remains deficient.  3. Osteoporosis: -  Her DEXA    Scan FROM AUGUST 2017 showed significant bone loss on spine and bilateral hips. I discussed treatment options with her and she agrees to continue  on Fosamax 70 mg by mouth weekly. Precautions and side effects discussed with her.  4. Vitamin D deficiency  - she is on vitamin D 50,000 units weekly, I advised her to continue. Since her vitamin D was only 16, she would benefit from continued  vitamin D supplement  .  5.  hypothyroidism - Her thyroid function tests are consistent with appropriate replacement.  - I advised her to continue levothyroxine 100 g by mouth every morning.    - We discussed about correct intake of levothyroxine, at fasting, with water, separated by at least 30 minutes from breakfast, and separated by more than 4 hours from calcium, iron, multivitamins, acid reflux medications (PPIs). -Patient is made aware of the fact that thyroid hormone replacement is needed for life, dose to be adjusted by periodic monitoring of thyroid function tests.  6. Type 2 diabetes: -Uncontrolled with A1c of 9.4%. She is too overwhelmed and too unreliable to give insulin to. She has significant cognitive deficit, could not confirm to me if she is consistent on her medications. - I advised her to continue metformin 500 mg by mouth twice a day, Amaryl 2 mg by mouth daily with breakfast.  -  Suggestion is made for her to avoid simple carbohydrates  from her diet including Cakes, Sweet Desserts / Pastries, Ice Cream, Soda (diet and regular), Sweet Tea, Candies, Chips, Cookies, Store Bought Juices, Alcohol in Excess of  1-2 drinks a day, Artificial Sweeteners, and "Sugar-free" Products. This will help patient to have stable blood glucose profile and potentially avoid unintended weight gain. She wishes to continue with her primary medical doctor about diabetes.    - I advised patient to maintain close follow up with Andres Shad, MD for primary care needs. Follow up plan: Return in about 2  weeks (around 06/23/2017) for labs today, 24 Hours Urine Calcium, meter .  Glade Lloyd, MD Phone: [phone]  Fax: [phone]   06/09/2017, 1:23 PM

## 2017-06-22 ENCOUNTER — Ambulatory Visit: Payer: Medicare Other | Admitting: "Endocrinology

## 2017-09-07 ENCOUNTER — Other Ambulatory Visit: Payer: Self-pay | Admitting: "Endocrinology

## 2018-01-08 ENCOUNTER — Other Ambulatory Visit: Payer: Self-pay

## 2018-01-08 MED ORDER — ALENDRONATE SODIUM 70 MG PO TABS: ORAL_TABLET | ORAL | 5 refills | Status: DC

## 2018-01-10 ENCOUNTER — Encounter: Payer: Self-pay | Admitting: "Endocrinology

## 2018-01-17 ENCOUNTER — Ambulatory Visit: Payer: Medicare Other | Admitting: "Endocrinology

## 2018-02-13 ENCOUNTER — Encounter: Payer: Self-pay | Admitting: "Endocrinology

## 2018-02-13 ENCOUNTER — Ambulatory Visit (INDEPENDENT_AMBULATORY_CARE_PROVIDER_SITE_OTHER): Payer: Medicare Other | Admitting: "Endocrinology

## 2018-02-13 VITALS — BP 133/82 | HR 71 | Ht 65.0 in | Wt 179.0 lb

## 2018-02-13 DIAGNOSIS — E210 Primary hyperparathyroidism: Secondary | ICD-10-CM

## 2018-02-13 DIAGNOSIS — E038 Other specified hypothyroidism: Secondary | ICD-10-CM

## 2018-02-13 DIAGNOSIS — M810 Age-related osteoporosis without current pathological fracture: Secondary | ICD-10-CM

## 2018-02-13 MED ORDER — LEVOTHYROXINE SODIUM 88 MCG PO TABS: 88.0000 ug | ORAL_TABLET | Freq: Every day | ORAL | 3 refills | Status: DC

## 2018-02-13 NOTE — Progress Notes (Signed)
Endocrinology follow-up note   Subjective:    Patient ID: Priscilla Bowers, female    DOB: 13-Sep-1950, PCP Andres Shad, MD   Past Medical History:  Diagnosis Date  . Diabetes mellitus, type II ([place])   . Hyperlipidemia   . Hypertension   . Hypothyroidism    Past Surgical History:  Procedure Laterality Date  . TONSILLECTOMY    . TUBAL LIGATION     Social History   Socioeconomic History  . Marital status: Unknown    Spouse name: Not on file  . Number of children: Not on file  . Years of education: Not on file  . Highest education level: Not on file  Occupational History  . Not on file  Social Needs  . Financial resource strain: Not on file  . Food insecurity:    Worry: Not on file    Inability: Not on file  . Transportation needs:    Medical: Not on file    Non-medical: Not on file  Tobacco Use  . Smoking status: Never Smoker  . Smokeless tobacco: Never Used  Substance and Sexual Activity  . Alcohol use: No  . Drug use: No  . Sexual activity: Not on file  Lifestyle  . Physical activity:    Days per week: Not on file    Minutes per session: Not on file  . Stress: Not on file  Relationships  . Social connections:    Talks on phone: Not on file    Gets together: Not on file    Attends religious service: Not on file    Active member of club or organization: Not on file    Attends meetings of clubs or organizations: Not on file    Relationship status: Not on file  Other Topics Concern  . Not on file  Social History Narrative  . Not on file   Outpatient Encounter Medications as of 02/13/2018  Medication Sig  . alendronate (FOSAMAX) 70 MG tablet Take 1 tablet by mouth every week take with a full glass of water on an empty stomach  . amLODipine (NORVASC) 5 MG tablet Take 5 mg by mouth daily.  . Blood Glucose Monitoring Suppl (ACCU-CHEK GUIDE) w/Device KIT 1 Piece by Does not apply route as directed.  Marland Kitchen glimepiride (AMARYL) 2 MG tablet Take 2 mg by  mouth daily with breakfast.  . glucose blood (ACCU-CHEK GUIDE) test strip Use as instructed  . levothyroxine (SYNTHROID, LEVOTHROID) 88 MCG tablet Take 1 tablet (88 mcg total) by mouth daily before breakfast.  . losartan (COZAAR) 25 MG tablet Take 25 mg by mouth daily.  . Magnesium Oxide 400 (240 Mg) MG TABS Take 1 tablet (400 mg total) by mouth 2 (two) times daily with a meal.  . meclizine (ANTIVERT) 25 MG tablet Take 25 mg by mouth as needed for dizziness.  . metFORMIN (GLUCOPHAGE) 500 MG tablet Take by mouth 2 (two) times daily with a meal.  . metoprolol tartrate (LOPRESSOR) 25 MG tablet Take 25 mg by mouth 2 (two) times daily.  . Vitamin D, Ergocalciferol, (DRISDOL) 50000 units CAPS capsule Take 1 capsule by mouth every week  . [DISCONTINUED] levothyroxine (SYNTHROID, LEVOTHROID) 100 MCG tablet Take 1 tablet (100 mcg total) by mouth daily before breakfast.   No facility-administered encounter medications on file as of 02/13/2018.    ALLERGIES: No Known Allergies VACCINATION STATUS:  There is no immunization history on file for this patient.  HPI 66 -year-old female patient with medical history  as above. She is being seen follow-up for primary hyperparathyroidism complicated by hypercalcemia, osteoporosis ; hypothyroidism on levothyroxine. - She has been on observation for primary hyperparathyroidism while treating for osteoporosis, vitamin D deficiency, and hypomagnesemia. Patient is a poor historian. Per documented referral package, she was diagnosed with hyperparathyroidism in the past. This was based on hypercalcemia associated with elevated PTH levels. She did have parathyroid scan which was negative for parathyroid adenoma on 04/03/2013.  Her  lab work from 10/23/2015 showed calcium elevated at 10.9, PTH elevated at 207, phosphorus decreased at 2.0, vitamin D low at 16.8. - Her  labs from 06/01/2016 showed intact PTH remaining high at 229 associated with hypercalcemia of 10.4. She was  also found to have hypomagnesemia of 1.6. She has normal renal function.  Prior to her last visit, 24 hour urine calcium was within normal range at 168. - On other labs on 11/04/2016, PTH was improved to 85 associated with calcium improved to 10.6, phosphorus 2.0, magnesium still low at 1.5, free T4 0.97, TSH 2.19. - Repeat labs before this visit on 06/08/2017 showed PTH backup at 138 associated with high calcium of 10.6.  -Her most recent labs from January 10, 2018 showed elevated TSH of 130 associated with hypercalcemia of 11.1.  Magnesium was 1.7 normal, phosphorus low at 2.3. - She reports better consistency and her magnesium intake as well as thyroid hormone   She denies family history of hypercalcemia requiring parathyroidectomy. -Her daily dairy intake is average. - She denies history of nephrolithiasis, seizure disorders, however her significant osteoporosis on treatment with alendronate 70 mg by mouth weekly. She is tolerating this medication very well.  -She has long-standing hypothyroidism on levothyroxine 100 g by mouth every morning. She reports hypothyroidism in multiple family members. - She also have chronically uncontrolled type 2 diabetes on treatment with metformin and Amaryl. She does  not monitor blood glucose regularly.  She follows with her PMD for diabetes care.    - She has no new complaints today.   Review of Systems   Constitutional: + Steady weight, no fatigue, no subjective hyperthermia/hypothermia Eyes: no blurry vision, no xerophthalmia ENT: no sore throat, no nodules palpated in throat, no dysphagia/odynophagia, no hoarseness Cardiovascular: no CP/SOB/palpitations/leg swelling Musculoskeletal: no muscle/joint aches Skin: no rashes Neurological: no tremors/numbness/tingling/dizziness Psychiatric: no depression/anxiety  Objective:    BP 133/82   Pulse 71   Ht _0  (1.651 m)   Wt 179 lb (81.2 kg)   BMI 29.79 kg/m   Wt Readings from Last 3 Encounters:   02/13/18 179 lb (81.2 kg)  06/09/17 181 lb (82.1 kg)  11/16/16 183 lb (83 kg)    Physical Exam  Constitutional: +over  Weight, not in acute distress.   Eyes: PERRLA, EOMI, no exophthalmos ENT: moist mucous membranes, no thyromegaly, no cervical lymphadenopathy Musculoskeletal: No gross deformities strength intact in all 4 Skin: moist, warm, no rashes Neurological: no tremor with outstretched hands, DTR normal in all 4    Labs from January 12, 2018 24-hour urine calcium 196 elevated (normal 50-1 50 mg / 24 hours). Labs from January 10, 2018 phosphorus 2.3 low (normal 2.5-4.9), magnesium 1.7 (normal 1.6-2.6) Labs from January 10, 2018 showed calcium 11.1 (normal 8.7-10.3), PTH elevated at 130 (normal 15- 65).    Labs from the 06/08/2017: PTH 138, serum 10.6, TSH 2.41, free T4 0.98, A1c 9.4, BUN 12, creatinine 0.77.  - On her labs on 11/04/2016, PTH was 85 associated with calcium improved to 10.6, phosphorus  2.0, magnesium still low at 1.5, free T4 0.97, TSH 2.19.  - Labs from 06/01/2016 showed calcium at 10.4, intact PTH remains high at 229, magnesium low at 1.6, normal renal function.   TSH 1.73, free T4 1.11  labs from 10/23/2015 showed: Calcium Elevated at 10.9, phosphorus lower at 2.0, PTH elevated at 207, vitamin D  Low at 16.8  -Parathyroid scan from 2014 was negative for parathyroid adenoma.  Her bone density showed T score -3.3 on spine and -2.6 on bilateral femur. - 24 hour urine calcium repeated on 06/16/2016 was 117 improving from 168.    Assessment & Plan:   1. Hyperparathyroidism, primary: -Hypercalcemia  Is increasing to 11.1 associated with high PTH of 130 (normal 15-65).    She has had negative parathyroid scan in 2014.  Phosphorus has been low normal at 2.3.  She has complications including osteoporosis. -Likely diagnosis is primary hyperparathyroidism. - Prior to her last visit, her 24-hour urine calcium  was 168 (normal 35-250 ), considered within normal range,  117 at another measurement in 2017.  Her most recent 24-hour urine calcium is 196 elevated (normal 50-1 50).  - She is on vitamin D supplement to correct profound vitamin D deficiency. -At this point, she is a surgical candidate.  I discussed and referred her to Dr. Armandina Gemma in [place] for possible surgical exploration for primary hyperparathyroidism.  2. Hypomagnesemia : Magnesium is corrected at 1.7.  She may still continue to benefit from magnesium oxide 400 mg p.o. twice daily.  onsidered for continued magnesium supplement if she remains deficient.  3. Osteoporosis: -  Her DEXA   Scan FROM AUGUST 2017 showed significant bone loss on spine and bilateral hips. I discussed treatment options with her and she agrees to continue  on Fosamax 70 mg by mouth weekly.  She is tolerating this medication very well.  precautions and side effects discussed with her.  She will be considered for repeat DEXA after her next visit.   4.  hypothyroidism Her previsit thyroid function tests are consistent with slight over replacement.  I discussed and lowered her levothyroxine to 88 mcg p.o. daily before breakfast.     - We discussed about correct intake of levothyroxine, at fasting, with water, separated by at least 30 minutes from breakfast, and separated by more than 4 hours from calcium, iron, multivitamins, acid reflux medications (PPIs). -Patient is made aware of the fact that thyroid hormone replacement is needed for life, dose to be adjusted by periodic monitoring of thyroid function tests.  5. Type 2 diabetes: -No recent A1c, she wishes to continue follow-up with her PMD for diabetes care.  She is currently on Amaryl and metformin.    -  Suggestion is made for her to avoid simple carbohydrates  from her diet including Cakes, Sweet Desserts / Pastries, Ice Cream, Soda (diet and regular), Sweet Tea, Candies, Chips, Cookies, Store Bought Juices, Alcohol in Excess of  1-2 drinks a day, Artificial  Sweeteners, and "Sugar-free" Products. This will help patient to have stable blood glucose profile and potentially avoid unintended weight gain.   - I advised patient to maintain close follow up with Andres Shad, MD for primary care needs. Follow up plan: Return in about 3 months (around 05/16/2018) for Follow up with Labs after Surgery.  Glade Lloyd, MD Phone: [phone]  Fax: [phone]   02/13/2018, 2:12 PM

## 2018-03-14 ENCOUNTER — Other Ambulatory Visit (HOSPITAL_COMMUNITY): Payer: Self-pay | Admitting: Surgery

## 2018-03-14 DIAGNOSIS — E210 Primary hyperparathyroidism: Secondary | ICD-10-CM

## 2018-03-23 ENCOUNTER — Ambulatory Visit (HOSPITAL_COMMUNITY)
Admission: RE | Admit: 2018-03-23 | Discharge: 2018-03-23 | Disposition: A | Discharge status: Home / Self Care | Payer: Medicare Other | Source: Ambulatory Visit | Attending: Surgery | Admitting: Surgery

## 2018-03-23 ENCOUNTER — Encounter (HOSPITAL_COMMUNITY)
Admission: RE | Admit: 2018-03-23 | Discharge: 2018-03-23 | Disposition: A | Discharge status: Home / Self Care | Payer: Medicare Other | Source: Ambulatory Visit | Attending: Surgery | Admitting: Surgery

## 2018-03-23 DIAGNOSIS — E210 Primary hyperparathyroidism: Secondary | ICD-10-CM | POA: Insufficient documentation

## 2018-03-23 DIAGNOSIS — R590 Localized enlarged lymph nodes: Secondary | ICD-10-CM | POA: Insufficient documentation

## 2018-03-23 MED ORDER — TECHNETIUM TC 99M SESTAMIBI - CARDIOLITE
24.0000 | Freq: Once | INTRAVENOUS | Status: AC | PRN
  Administered 2018-03-23: 10:00:00 24 via INTRAVENOUS

## 2018-03-29 ENCOUNTER — Ambulatory Visit: Payer: Self-pay | Admitting: Surgery

## 2018-04-13 NOTE — Pre-Procedure Instructions (Signed)
Priscilla Bowers  04/13/2018      [place], [place] [address] [place] [address] Phone: [phone] Fax: [phone]    Your procedure is scheduled on Mon. Nov. 4, 2019 from 12:00PM-1:15PM  Report to [facility] Admitting Entrance "A" at 10:00AM  Call this number if you have problems the morning of surgery:  [phone]   Remember:  Do not eat or drink after midnight on Nov. 3rd    Take these medicines the morning of surgery with A SIP OF WATER: AmLODipine (NORVASC) and Levothyroxine (SYNTHROID, LEVOTHROID) If needed: Meclizine (ANTIVERT)    As of today, stop taking all Other Aspirin Products, Vitamins, Fish oils, and Herbal medications. Also stop all NSAIDS i.e. Advil, Ibuprofen, Motrin, Aleve, Anaprox, Naproxen, BC, Goody Powders, and all Supplements.  How to Manage Your Diabetes Before and After Surgery  Why is it important to control my blood sugar before and after surgery? . Improving blood sugar levels before and after surgery helps healing and can limit problems. . A way of improving blood sugar control is eating a healthy diet by: o  Eating less sugar and carbohydrates o  Increasing activity/exercise o  Talking with your doctor about reaching your blood sugar goals . High blood sugars (greater than 180 mg/dL) can raise your risk of infections and slow your recovery, so you will need to focus on controlling your diabetes during the weeks before surgery. . Make sure that the doctor who takes care of your diabetes knows about your planned surgery including the date and location.  How do I manage my blood sugar before surgery? . Check your blood sugar at least 4 times a day, starting 2 days before surgery, to make sure that the level is not too high or low. o Check your blood sugar the morning of your surgery when you wake up and every 2 hours until you get to the Short Stay unit. . If your blood sugar is less than 70 mg/dL,  you will need to treat for low blood sugar: o Do not take insulin. o Treat a low blood sugar (less than 70 mg/dL) with  cup of clear juice (cranberry or apple), 4 glucose tablets, OR glucose gel. Recheck blood sugar in 15 minutes after treatment (to make sure it is greater than 70 mg/dL). If your blood sugar is not greater than 70 mg/dL on recheck, call [phone] o  for further instructions. .  If your CBG is greater than 220 mg/dL, call the number above for further instructions.  . If you are admitted to the hospital after surgery: o Your blood sugar will be checked by the staff and you will probably be given insulin after surgery (instead of oral diabetes medicines) to make sure you have good blood sugar levels. o The goal for blood sugar control after surgery is 80-180 mg/dL.  WHAT DO I DO ABOUT MY DIABETES MEDICATION?  Marland Kitchen Do not take MetFORMIN (GLUCOPHAGE) the morning of surgery.  Reviewed and Endorsed by [facility] Patient Education Committee, August 2015   Do not wear jewelry, make-up or nail polish.  Do not wear lotions, powders, or perfumes, or deodorant.  Do not shave 48 hours prior to surgery.    Do not bring valuables to the hospital.  [facility] is not responsible for any belongings or valuables.  Contacts, dentures or bridgework may not be worn into surgery.  Leave your suitcase in the car.  After  surgery it may be brought to your room.  For patients admitted to the hospital, discharge time will be determined by your treatment team.  Patients discharged the day of surgery will not be allowed to drive home.   Special instructions:  [facility]- Preparing For Surgery  Before surgery, you can play an important role. Because skin is not sterile, your skin needs to be as free of germs as possible. You can reduce the number of germs on your skin by washing with CHG (chlorahexidine gluconate) Soap before surgery.  CHG is an antiseptic cleaner which kills germs and bonds  with the skin to continue killing germs even after washing.    Oral Hygiene is also important to reduce your risk of infection.  Remember - BRUSH YOUR TEETH THE MORNING OF SURGERY WITH YOUR REGULAR TOOTHPASTE  Please do not use if you have an allergy to CHG or antibacterial soaps. If your skin becomes reddened/irritated stop using the CHG.  Do not shave (including legs and underarms) for at least 48 hours prior to first CHG shower. It is OK to shave your face.  Please follow these instructions carefully.   1. Shower the NIGHT BEFORE SURGERY and the MORNING OF SURGERY with CHG.   2. If you chose to wash your hair, wash your hair first as usual with your normal shampoo.  3. After you shampoo, rinse your hair and body thoroughly to remove the shampoo.  4. Use CHG as you would any other liquid soap. You can apply CHG directly to the skin and wash gently with a scrungie or a clean washcloth.   5. Apply the CHG Soap to your body ONLY FROM THE NECK DOWN.  Do not use on open wounds or open sores. Avoid contact with your eyes, ears, mouth and genitals (private parts). Wash Face and genitals (private parts)  with your normal soap.  6. Wash thoroughly, paying special attention to the area where your surgery will be performed.  7. Thoroughly rinse your body with warm water from the neck down.  8. DO NOT shower/wash with your normal soap after using and rinsing off the CHG Soap.  9. Pat yourself dry with a CLEAN TOWEL.  10. Wear CLEAN PAJAMAS to bed the night before surgery, wear comfortable clothes the morning of surgery  11. Place CLEAN SHEETS on your bed the night of your first shower and DO NOT SLEEP WITH PETS.  Day of Surgery:  Do not apply any deodorants/lotions.  Please wear clean clothes to the hospital/surgery center.   Remember to brush your teeth WITH YOUR REGULAR TOOTHPASTE.  Please read over the following fact sheets that you were given. Pain Booklet, Coughing and Deep Breathing  and Surgical Site Infection Prevention

## 2018-04-16 ENCOUNTER — Ambulatory Visit (HOSPITAL_COMMUNITY)
Admission: RE | Admit: 2018-04-16 | Discharge: 2018-04-16 | Disposition: A | Discharge status: Home / Self Care | Payer: Medicare Other | Source: Ambulatory Visit | Attending: Surgery | Admitting: Surgery

## 2018-04-16 ENCOUNTER — Encounter (HOSPITAL_COMMUNITY)
Admission: RE | Admit: 2018-04-16 | Discharge: 2018-04-16 | Disposition: A | Discharge status: Home / Self Care | Payer: Medicare Other | Source: Ambulatory Visit | Attending: Surgery | Admitting: Surgery

## 2018-04-16 ENCOUNTER — Encounter (HOSPITAL_COMMUNITY): Payer: Self-pay

## 2018-04-16 DIAGNOSIS — E119 Type 2 diabetes mellitus without complications: Secondary | ICD-10-CM | POA: Diagnosis not present

## 2018-04-16 DIAGNOSIS — Z01818 Encounter for other preprocedural examination: Secondary | ICD-10-CM | POA: Diagnosis present

## 2018-04-16 DIAGNOSIS — I10 Essential (primary) hypertension: Secondary | ICD-10-CM | POA: Diagnosis not present

## 2018-04-16 DIAGNOSIS — I498 Other specified cardiac arrhythmias: Secondary | ICD-10-CM | POA: Diagnosis not present

## 2018-04-16 DIAGNOSIS — E210 Primary hyperparathyroidism: Secondary | ICD-10-CM | POA: Diagnosis not present

## 2018-04-16 DIAGNOSIS — Z79890 Hormone replacement therapy (postmenopausal): Secondary | ICD-10-CM | POA: Diagnosis not present

## 2018-04-16 DIAGNOSIS — Z79899 Other long term (current) drug therapy: Secondary | ICD-10-CM | POA: Diagnosis not present

## 2018-04-16 DIAGNOSIS — I440 Atrioventricular block, first degree: Secondary | ICD-10-CM | POA: Insufficient documentation

## 2018-04-16 DIAGNOSIS — Z7984 Long term (current) use of oral hypoglycemic drugs: Secondary | ICD-10-CM | POA: Diagnosis not present

## 2018-04-16 HISTORY — DX: Dyspnea, unspecified: R06.00

## 2018-04-16 LAB — CBC
HCT: 36.5 % (ref 36.0–46.0)
HEMOGLOBIN: 11.8 g/dL — AB (ref 12.0–15.0)
MCH: 25.1 pg — AB (ref 26.0–34.0)
MCHC: 32.3 g/dL (ref 30.0–36.0)
MCV: 77.5 fL — ABNORMAL LOW (ref 80.0–100.0)
PLATELETS: 296 10*3/uL (ref 150–400)
RBC: 4.71 MIL/uL (ref 3.87–5.11)
RDW: 14.8 % (ref 11.5–15.5)
WBC: 9 10*3/uL (ref 4.0–10.5)
nRBC: 0 % (ref 0.0–0.2)

## 2018-04-16 LAB — BASIC METABOLIC PANEL
Anion gap: 4 — ABNORMAL LOW (ref 5–15)
BUN: 12 mg/dL (ref 8–23)
CALCIUM: 11.1 mg/dL — AB (ref 8.9–10.3)
CO2: 23 mmol/L (ref 22–32)
CREATININE: 0.83 mg/dL (ref 0.44–1.00)
Chloride: 106 mmol/L (ref 98–111)
Glucose, Bld: 303 mg/dL — ABNORMAL HIGH (ref 70–99)
Potassium: 4.3 mmol/L (ref 3.5–5.1)
SODIUM: 133 mmol/L — AB (ref 135–145)

## 2018-04-16 LAB — HEMOGLOBIN A1C
Hgb A1c MFr Bld: 10.4 % — ABNORMAL HIGH (ref 4.8–5.6)
Mean Plasma Glucose: 251.78 mg/dL

## 2018-04-16 LAB — GLUCOSE, CAPILLARY: Glucose-Capillary: 315 mg/dL — ABNORMAL HIGH (ref 70–99)

## 2018-04-16 MED ORDER — CHLORHEXIDINE GLUCONATE CLOTH 2 % EX PADS: 6.0000 | MEDICATED_PAD | Freq: Once | CUTANEOUS | Status: DC

## 2018-04-17 NOTE — Anesthesia Preprocedure Evaluation (Addendum)
Anesthesia Evaluation  Patient identified by MRN, date of birth, ID band Patient awake    Reviewed: Allergy & Precautions, NPO status , Patient's Chart, lab work & pertinent test results, reviewed documented beta blocker date and time   Airway Mallampati: II  TM Distance: >3 FB Neck ROM: Full    Dental no notable dental hx.    Pulmonary neg pulmonary ROS,    Pulmonary exam normal breath sounds clear to auscultation       Cardiovascular hypertension, Pt. on medications and Pt. on home beta blockers Normal cardiovascular exam Rhythm:Regular Rate:Normal  ECG: SR, rate 69. 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   Neuro/Psych negative neurological ROS  negative psych ROS   GI/Hepatic negative GI ROS, Neg liver ROS,   Endo/Other  diabetes, Oral Hypoglycemic AgentsHypothyroidism   Renal/GU negative Renal ROS     Musculoskeletal negative musculoskeletal ROS (+)   Abdominal   Peds  Hematology negative hematology ROS (+) HLD   Anesthesia Other Findings PRIMARY HYPERPARATHYROIDISM  Reproductive/Obstetrics                           Anesthesia Physical Anesthesia Plan  ASA: III  Anesthesia Plan: General   Post-op Pain Management:    Induction: Intravenous  PONV Risk Score and Plan: 3 and Ondansetron, Dexamethasone, Midazolam and Treatment may vary due to age or medical condition  Airway Management Planned: Oral ETT  Additional Equipment:   Intra-op Plan:   Post-operative Plan: Extubation in OR  Informed Consent: I have reviewed the patients History and Physical, chart, labs and discussed the procedure including the risks, benefits and alternatives for the proposed anesthesia with the patient or authorized representative who has indicated his/her understanding and acceptance.   Dental advisory given  Plan Discussed with: CRNA  Anesthesia Plan Comments:        Anesthesia Quick Evaluation

## 2018-04-22 ENCOUNTER — Encounter (HOSPITAL_COMMUNITY): Payer: Self-pay | Admitting: Surgery

## 2018-04-22 NOTE — H&P (Addendum)
General Surgery [facility]  Priscilla Bowers DOB: 1951/04/29 Single / Language: Priscilla Bowers / Race: Black or African American Female   History of Present Illness   The patient is a 67 year old female who presents with primary hyperparathyroidism.  CHIEF COMPLAINT: primary hyperparathyroidism  Patient is referred by Dr. Bud Face for surgical evaluation and management of suspected primary hyperparathyroidism. Patient lives in [place], [place]. Patient has a long-standing history of hypercalcemia. She has undergone workup and evaluation as long as 5 years ago. She had a nuclear medicine parathyroid scan performed in 2014 which failed to demonstrate a parathyroid adenoma. Patient has recently been seen by her endocrinologist in [place]. Laboratory studies from July 2019 showed calcium of 11.1 and an intact PTH level of 130. Patient has had a bone density scan which shows osteoporosis. She has had a 24-hour urine collection performed recently. A vitamin D determination did show vitamin D deficiency and she is currently taking ergocalciferol 50,000 units weekly. Patient is now referred for further evaluation and recommendations regarding primary hyperparathyroidism. Patient has had no prior head or neck surgery. She has several family members who have thyroid disease. There is no family history of thyroid malignancy. There is no history of pituitary tumors or pancreatic tumors.   Past Surgical History Colon Removal - Partial   Diagnostic Studies History Colonoscopy  5-10 years ago Mammogram  never Pap Smear  1-5 years ago  Allergies No Known Drug Allergies [03/08/2018]: Allergies Reconciled   Medication History  Glimepiride (2MG  Tablet, Oral) Active. Levothyroxine Sodium (88MCG Tablet, Oral) Active. Vitamin D2 (Oral) Specific strength unknown - Active. metFORMIN HCl (500MG  Tablet, Oral) Active. amLODIPine Besylate (5MG  Tablet, Oral)  Active. Metoprolol Tartrate (25MG  Tablet, Oral) Active. Omeprazole (40MG  Capsule DR, Oral) Active. Losartan Potassium (25MG  Tablet, Oral) Active. Medications Reconciled  Social History Alcohol use  Occasional alcohol use. Caffeine use  Carbonated beverages, Coffee, Tea. No drug use  Tobacco use  Never smoker.  Family History Alcohol Abuse  Daughter, Family Members In [place], Father, Mother, Sister, Son. Anesthetic complications  Family Members In General. Arthritis  Son. Malignant Neoplasm Of Pancreas  Son. Ovarian Cancer  Family Members In General.  Pregnancy / Birth History Age at menarche  25 years. Gravida  3 Para  3  Other Problems Back Pain  Diabetes Mellitus  Gastroesophageal Reflux Disease  High blood pressure  Hypercholesterolemia  Migraine Headache  Thyroid Disease    Review of Systems  General Not Present- Appetite Loss, Chills, Fatigue, Fever, Night Sweats, Weight Gain and Weight Loss. Skin Not Present- Change in Wart/Mole, Dryness, Hives, Jaundice, New Lesions, Non-Healing Wounds, Rash and Ulcer. HEENT Present- Seasonal Allergies. Not Present- Earache, Hearing Loss, Hoarseness, Nose Bleed, Oral Ulcers, Ringing in the Ears, Sinus Pain, Sore Throat, Visual Disturbances, Wears glasses/contact lenses and Yellow Eyes. Respiratory Not Present- Bloody sputum, Chronic Cough, Difficulty Breathing, Snoring and Wheezing. Breast Not Present- Breast Mass, Breast Pain, Nipple Discharge and Skin Changes. Cardiovascular Present- Rapid Heart Rate. Not Present- Chest Pain, Difficulty Breathing Lying Down, Leg Cramps, Palpitations, Shortness of Breath and Swelling of Extremities. Gastrointestinal Not Present- Abdominal Pain, Bloating, Bloody Stool, Change in Bowel Habits, Chronic diarrhea, Constipation, Difficulty Swallowing, Excessive gas, Gets full quickly at meals, Hemorrhoids, Indigestion, Nausea, Rectal Pain and Vomiting. Female Genitourinary Present-  Nocturia. Not Present- Frequency, Painful Urination, Pelvic Pain and Urgency. Musculoskeletal Not Present- Back Pain, Joint Pain, Joint Stiffness, Muscle Pain, Muscle Weakness and Swelling of Extremities. Neurological Not Present- Decreased Memory, Fainting, Headaches,  Numbness, Seizures, Tingling, Tremor, Trouble walking and Weakness. Psychiatric Not Present- Anxiety, Bipolar, Change in Sleep Pattern, Depression, Fearful and Frequent crying. Hematology Not Present- Easy Bruising, Excessive bleeding, Gland problems, HIV and Persistent Infections.  Vitals Weight: 180 lb Height: 65in Body Surface Area: 1.89 m Body Mass Index: 29.95 kg/m  Temp.: 97.39F  Pulse: 96 (Regular)  BP: 136/84 (Sitting, Left Arm, Standard)   Physical Exam  See vital signs recorded above  GENERAL APPEARANCE Development: normal Nutritional status: normal Gross deformities: none  SKIN Rash, lesions, ulcers: none Induration, erythema: none Nodules: none palpable  EYES Conjunctiva and lids: normal Pupils: equal and reactive Iris: normal bilaterally  EARS, NOSE, MOUTH, THROAT External ears: no lesion or deformity External nose: no lesion or deformity Hearing: grossly normal Lips: no lesion or deformity Dentition: normal for age Oral mucosa: moist  NECK Symmetric: yes Trachea: midline Thyroid: no palpable nodules in the thyroid bed  CHEST Respiratory effort: normal Retraction or accessory muscle use: no Breath sounds: normal bilaterally Rales, rhonchi, wheeze: none  CARDIOVASCULAR Auscultation: regular rhythm, normal rate Murmurs: none Pulses: carotid and radial pulse 2+ palpable Lower extremity edema: none Lower extremity varicosities: none  MUSCULOSKELETAL Station and gait: normal Digits and nails: no clubbing or cyanosis Muscle strength: grossly normal all extremities Range of motion: grossly normal all extremities Deformity: none  LYMPHATIC Cervical: none  palpable Supraclavicular: none palpable  PSYCHIATRIC Oriented to person, place, and time: yes Mood and affect: normal for situation Judgment and insight: appropriate for situation    Assessment & Plan  PRIMARY HYPERPARATHYROIDISM (E21.0)  Follow Up - Call [facility] office after tests / studies doneto discuss further plans  Patient is referred by her endocrinologist for evaluation for primary hyperparathyroidism. She is provided with written literature on parathyroid surgery to review at home.  Patient has biochemical evidence of primary hyperparathyroidism. I would like to repeat her nuclear medicine parathyroid scan. It has been 5 years since her last study. The technique for the scan has changed to some degree. Also with 5 years in the interval, there likely may be increased activity in the parathyroid adenoma. We will also obtain an ultrasound examination of the neck both to evaluate the thyroid gland and to possibly identify an enlarged parathyroid gland.  When the studies are done, we will review the results and notify the patient. If they localize a parathyroid adenoma, then I believe she will be a good candidate for minimally invasive surgery. If the studies are unrevealing, then we will schedule her for a 4D CT scan of the neck.  We discussed parathyroid surgery. We discussed my hope that we could perform minimally invasive surgery. We discussed the fact that 95% of patients have a single gland adenoma. We discussed the location of the surgical incision, the length of the operation, and the hospital stay. We discussed her recovery from surgery. Patient understands and agrees to proceed with further evaluation.  Pt Education - Pamphlet Given - The Parathyroid Surgery Book: discussed with patient and provided information.  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  Plan minimally invasive surgical resection.  The risks and benefits of the procedure have  been discussed at length with the patient.  The patient understands the proposed procedure, potential alternative treatments, and the course of recovery to be expected.  All of the patient's questions have been answered at this time.  The patient wishes to proceed with surgery.  Armandina Gemma, [facility] Office: [phone]

## 2018-04-23 ENCOUNTER — Ambulatory Visit (HOSPITAL_COMMUNITY)
Admission: RE | Admit: 2018-04-23 | Discharge: 2018-04-23 | Disposition: A | Discharge status: Home / Self Care | Payer: Medicare Other | Source: Ambulatory Visit | Attending: Surgery | Admitting: Surgery

## 2018-04-23 ENCOUNTER — Ambulatory Visit (HOSPITAL_COMMUNITY): Payer: Medicare Other | Admitting: Physician Assistant

## 2018-04-23 ENCOUNTER — Encounter (HOSPITAL_COMMUNITY)
Admission: RE | Disposition: A | Discharge status: Home / Self Care | Payer: Self-pay | Source: Ambulatory Visit | Attending: Surgery

## 2018-04-23 ENCOUNTER — Encounter (HOSPITAL_COMMUNITY): Payer: Self-pay

## 2018-04-23 ENCOUNTER — Other Ambulatory Visit: Payer: Self-pay

## 2018-04-23 DIAGNOSIS — Z7984 Long term (current) use of oral hypoglycemic drugs: Secondary | ICD-10-CM | POA: Diagnosis not present

## 2018-04-23 DIAGNOSIS — E7800 Pure hypercholesterolemia, unspecified: Secondary | ICD-10-CM | POA: Diagnosis not present

## 2018-04-23 DIAGNOSIS — E559 Vitamin D deficiency, unspecified: Secondary | ICD-10-CM | POA: Insufficient documentation

## 2018-04-23 DIAGNOSIS — D351 Benign neoplasm of parathyroid gland: Secondary | ICD-10-CM | POA: Diagnosis not present

## 2018-04-23 DIAGNOSIS — K219 Gastro-esophageal reflux disease without esophagitis: Secondary | ICD-10-CM | POA: Insufficient documentation

## 2018-04-23 DIAGNOSIS — I10 Essential (primary) hypertension: Secondary | ICD-10-CM | POA: Diagnosis not present

## 2018-04-23 DIAGNOSIS — Z79899 Other long term (current) drug therapy: Secondary | ICD-10-CM | POA: Insufficient documentation

## 2018-04-23 DIAGNOSIS — E119 Type 2 diabetes mellitus without complications: Secondary | ICD-10-CM | POA: Diagnosis not present

## 2018-04-23 DIAGNOSIS — E210 Primary hyperparathyroidism: Secondary | ICD-10-CM | POA: Diagnosis not present

## 2018-04-23 HISTORY — PX: PARATHYROIDECTOMY: SHX19

## 2018-04-23 LAB — GLUCOSE, CAPILLARY
GLUCOSE-CAPILLARY: 141 mg/dL — AB (ref 70–99)
Glucose-Capillary: 152 mg/dL — ABNORMAL HIGH (ref 70–99)

## 2018-04-23 SURGERY — PARATHYROIDECTOMY
Anesthesia: General | Site: Neck | Laterality: Left

## 2018-04-23 MED ORDER — FENTANYL CITRATE (PF) 250 MCG/5ML IJ SOLN
INTRAMUSCULAR | Status: DC | PRN
  Administered 2018-04-23: 100 ug via INTRAVENOUS
  Administered 2018-04-23: 50 ug via INTRAVENOUS

## 2018-04-23 MED ORDER — DEXAMETHASONE SODIUM PHOSPHATE 10 MG/ML IJ SOLN
INTRAMUSCULAR | Status: AC
  Filled 2018-04-23: qty 1

## 2018-04-23 MED ORDER — BUPIVACAINE-EPINEPHRINE (PF) 0.25% -1:200000 IJ SOLN
INTRAMUSCULAR | Status: AC
  Filled 2018-04-23: qty 30

## 2018-04-23 MED ORDER — CEFAZOLIN SODIUM-DEXTROSE 2-4 GM/100ML-% IV SOLN
INTRAVENOUS | Status: AC
  Filled 2018-04-23: qty 100

## 2018-04-23 MED ORDER — DEXAMETHASONE SODIUM PHOSPHATE 10 MG/ML IJ SOLN
INTRAMUSCULAR | Status: DC | PRN
  Administered 2018-04-23: 4 mg via INTRAVENOUS

## 2018-04-23 MED ORDER — FAMOTIDINE 20 MG PO TABS
20.0000 mg | ORAL_TABLET | Freq: Once | ORAL | Status: AC
  Administered 2018-04-23: 20 mg via ORAL
  Filled 2018-04-23: qty 1

## 2018-04-23 MED ORDER — TRAMADOL HCL 50 MG PO TABS
ORAL_TABLET | ORAL | Status: AC
  Filled 2018-04-23: qty 1

## 2018-04-23 MED ORDER — LIDOCAINE 2% (20 MG/ML) 5 ML SYRINGE
INTRAMUSCULAR | Status: DC | PRN
  Administered 2018-04-23: 60 mg via INTRAVENOUS

## 2018-04-23 MED ORDER — TRAMADOL HCL 50 MG PO TABS: 50.0000 mg | ORAL_TABLET | Freq: Four times a day (QID) | ORAL | 0 refills | Status: DC | PRN

## 2018-04-23 MED ORDER — FENTANYL CITRATE (PF) 250 MCG/5ML IJ SOLN
INTRAMUSCULAR | Status: AC
  Filled 2018-04-23: qty 5

## 2018-04-23 MED ORDER — BUPIVACAINE HCL (PF) 0.25 % IJ SOLN
INTRAMUSCULAR | Status: AC
  Filled 2018-04-23: qty 30

## 2018-04-23 MED ORDER — MIDAZOLAM HCL 2 MG/2ML IJ SOLN
INTRAMUSCULAR | Status: AC
  Filled 2018-04-23: qty 2

## 2018-04-23 MED ORDER — ROCURONIUM BROMIDE 10 MG/ML (PF) SYRINGE
PREFILLED_SYRINGE | INTRAVENOUS | Status: DC | PRN
  Administered 2018-04-23: 20 mg via INTRAVENOUS
  Administered 2018-04-23: 30 mg via INTRAVENOUS

## 2018-04-23 MED ORDER — ONDANSETRON HCL 4 MG/2ML IJ SOLN
INTRAMUSCULAR | Status: DC | PRN
  Administered 2018-04-23: 4 mg via INTRAVENOUS

## 2018-04-23 MED ORDER — SODIUM CHLORIDE 0.9 % IV SOLN
INTRAVENOUS | Status: DC | PRN
  Administered 2018-04-23: 20 ug/min via INTRAVENOUS

## 2018-04-23 MED ORDER — ROCURONIUM BROMIDE 50 MG/5ML IV SOSY
PREFILLED_SYRINGE | INTRAVENOUS | Status: AC
  Filled 2018-04-23: qty 5

## 2018-04-23 MED ORDER — ACETAMINOPHEN 500 MG PO TABS
1000.0000 mg | ORAL_TABLET | Freq: Once | ORAL | Status: AC
  Administered 2018-04-23: 1000 mg via ORAL
  Filled 2018-04-23: qty 2

## 2018-04-23 MED ORDER — PROPOFOL 10 MG/ML IV BOLUS
INTRAVENOUS | Status: AC
  Filled 2018-04-23: qty 20

## 2018-04-23 MED ORDER — 0.9 % SODIUM CHLORIDE (POUR BTL) OPTIME
TOPICAL | Status: DC | PRN
  Administered 2018-04-23 (×2): 1000 mL

## 2018-04-23 MED ORDER — TRAMADOL HCL 50 MG PO TABS
50.0000 mg | ORAL_TABLET | Freq: Once | ORAL | Status: AC
  Administered 2018-04-23: 50 mg via ORAL

## 2018-04-23 MED ORDER — SUGAMMADEX SODIUM 200 MG/2ML IV SOLN
INTRAVENOUS | Status: DC | PRN
  Administered 2018-04-23: 200 mg via INTRAVENOUS

## 2018-04-23 MED ORDER — LACTATED RINGERS IV SOLN
INTRAVENOUS | Status: DC
  Administered 2018-04-23: 11:00:00 via INTRAVENOUS

## 2018-04-23 MED ORDER — BUPIVACAINE HCL (PF) 0.25 % IJ SOLN
INTRAMUSCULAR | Status: DC | PRN
  Administered 2018-04-23: 9 mL

## 2018-04-23 MED ORDER — ONDANSETRON HCL 4 MG/2ML IJ SOLN
INTRAMUSCULAR | Status: AC
  Filled 2018-04-23: qty 2

## 2018-04-23 MED ORDER — ACETAMINOPHEN 160 MG/5ML PO SOLN: 960.0000 mg | Freq: Once | ORAL | Status: AC

## 2018-04-23 MED ORDER — FENTANYL CITRATE (PF) 100 MCG/2ML IJ SOLN: 25.0000 ug | INTRAMUSCULAR | Status: DC | PRN

## 2018-04-23 MED ORDER — PROPOFOL 10 MG/ML IV BOLUS
INTRAVENOUS | Status: DC | PRN
  Administered 2018-04-23: 200 mg via INTRAVENOUS

## 2018-04-23 MED ORDER — CEFAZOLIN SODIUM-DEXTROSE 2-4 GM/100ML-% IV SOLN
2.0000 g | INTRAVENOUS | Status: AC
  Administered 2018-04-23: 2 g via INTRAVENOUS

## 2018-04-23 MED ORDER — LIDOCAINE 2% (20 MG/ML) 5 ML SYRINGE
INTRAMUSCULAR | Status: AC
  Filled 2018-04-23: qty 5

## 2018-04-23 MED ORDER — PHENYLEPHRINE 40 MCG/ML (10ML) SYRINGE FOR IV PUSH (FOR BLOOD PRESSURE SUPPORT)
PREFILLED_SYRINGE | INTRAVENOUS | Status: AC
  Filled 2018-04-23: qty 10

## 2018-04-23 MED ORDER — MIDAZOLAM HCL 5 MG/5ML IJ SOLN
INTRAMUSCULAR | Status: DC | PRN
  Administered 2018-04-23: 2 mg via INTRAVENOUS

## 2018-04-23 MED ORDER — ONDANSETRON HCL 4 MG/2ML IJ SOLN: 4.0000 mg | Freq: Once | INTRAMUSCULAR | Status: DC | PRN

## 2018-04-23 SURGICAL SUPPLY — 51 items
ATTRACTOMAT 16X20 MAGNETIC DRP (DRAPES) ×3 IMPLANT
BLADE SURG 15 STRL LF DISP TIS (BLADE) ×1 IMPLANT
BLADE SURG 15 STRL SS (BLADE) ×2
CANISTER SUCT 3000ML PPV (MISCELLANEOUS) ×3 IMPLANT
CHLORAPREP W/TINT 26ML (MISCELLANEOUS) ×3 IMPLANT
CLIP VESOCCLUDE MED 6/CT (CLIP) ×3 IMPLANT
CLIP VESOCCLUDE SM WIDE 6/CT (CLIP) ×3 IMPLANT
CLOSURE WOUND 1/2 X4 (GAUZE/BANDAGES/DRESSINGS) ×1
CONT SPEC 4OZ CLIKSEAL STRL BL (MISCELLANEOUS) ×3 IMPLANT
COVER SURGICAL LIGHT HANDLE (MISCELLANEOUS) ×3 IMPLANT
COVER WAND RF STERILE (DRAPES) IMPLANT
CRADLE DONUT ADULT HEAD (MISCELLANEOUS) ×3 IMPLANT
DERMABOND ADVANCED (GAUZE/BANDAGES/DRESSINGS) ×2
DERMABOND ADVANCED .7 DNX12 (GAUZE/BANDAGES/DRESSINGS) ×1 IMPLANT
DRAPE LAPAROTOMY 100X72 PEDS (DRAPES) ×3 IMPLANT
DRAPE UTILITY XL STRL (DRAPES) ×3 IMPLANT
ELECT CAUTERY BLADE 6.4 (BLADE) ×3 IMPLANT
ELECT REM PT RETURN 9FT ADLT (ELECTROSURGICAL) ×3
ELECTRODE REM PT RTRN 9FT ADLT (ELECTROSURGICAL) ×1 IMPLANT
GAUZE 4X4 16PLY RFD (DISPOSABLE) ×3 IMPLANT
GAUZE SPONGE 2X2 8PLY STRL LF (GAUZE/BANDAGES/DRESSINGS) ×1 IMPLANT
GLOVE SURG ORTHO 8.0 STRL STRW (GLOVE) ×3 IMPLANT
GOWN STRL REUS W/ TWL LRG LVL3 (GOWN DISPOSABLE) ×1 IMPLANT
GOWN STRL REUS W/ TWL XL LVL3 (GOWN DISPOSABLE) ×1 IMPLANT
GOWN STRL REUS W/TWL LRG LVL3 (GOWN DISPOSABLE) ×2
GOWN STRL REUS W/TWL XL LVL3 (GOWN DISPOSABLE) ×2
HEMOSTAT ARISTA ABSORB 3G PWDR (MISCELLANEOUS) IMPLANT
HEMOSTAT SURGICEL 2X4 FIBR (HEMOSTASIS) ×3 IMPLANT
ILLUMINATOR WAVEGUIDE N/F (MISCELLANEOUS) IMPLANT
KIT BASIN OR (CUSTOM PROCEDURE TRAY) ×3 IMPLANT
KIT TURNOVER KIT B (KITS) ×3 IMPLANT
NEEDLE HYPO 25GX1X1/2 BEV (NEEDLE) ×3 IMPLANT
NS IRRIG 1000ML POUR BTL (IV SOLUTION) ×3 IMPLANT
PACK SURGICAL SETUP 50X90 (CUSTOM PROCEDURE TRAY) ×3 IMPLANT
PAD ARMBOARD 7.5X6 YLW CONV (MISCELLANEOUS) ×3 IMPLANT
PENCIL BUTTON HOLSTER BLD 10FT (ELECTRODE) ×3 IMPLANT
SPONGE GAUZE 2X2 STER 10/PKG (GAUZE/BANDAGES/DRESSINGS) ×2
SPONGE INTESTINAL PEANUT (DISPOSABLE) IMPLANT
STRIP CLOSURE SKIN 1/2X4 (GAUZE/BANDAGES/DRESSINGS) ×2 IMPLANT
SUT MNCRL AB 4-0 PS2 18 (SUTURE) ×3 IMPLANT
SUT SILK 2 0 (SUTURE)
SUT SILK 2-0 18XBRD TIE 12 (SUTURE) IMPLANT
SUT SILK 3 0 (SUTURE)
SUT SILK 3-0 18XBRD TIE 12 (SUTURE) IMPLANT
SUT VIC AB 3-0 SH 18 (SUTURE) ×3 IMPLANT
SYR BULB 3OZ (MISCELLANEOUS) ×3 IMPLANT
SYR CONTROL 10ML LL (SYRINGE) ×3 IMPLANT
TOWEL OR 17X24 6PK STRL BLUE (TOWEL DISPOSABLE) ×3 IMPLANT
TOWEL OR 17X26 10 PK STRL BLUE (TOWEL DISPOSABLE) ×3 IMPLANT
TUBE CONNECTING 12'X1/4 (SUCTIONS) ×1
TUBE CONNECTING 12X1/4 (SUCTIONS) ×2 IMPLANT

## 2018-04-23 NOTE — Anesthesia Postprocedure Evaluation (Signed)
Anesthesia Post Note  Patient: Priscilla Bowers  Procedure(s) Performed: LEFT SUPERIOR AND INFERIOR PARATHYROIDECTOMY (Left Neck)     Patient location during evaluation: PACU Anesthesia Type: General Level of consciousness: awake and alert Pain management: pain level controlled Vital Signs Assessment: post-procedure vital signs reviewed and stable Respiratory status: spontaneous breathing, nonlabored ventilation, respiratory function stable and patient connected to nasal cannula oxygen Cardiovascular status: blood pressure returned to baseline and stable Postop Assessment: no apparent nausea or vomiting Anesthetic complications: no    Last Vitals:  Vitals:   04/23/18 1500 04/23/18 1530  BP: (!) 145/92 (!) 147/92  Pulse: 78 85  Resp: 11 18  Temp:    SpO2: 100% 96%    Last Pain:  Vitals:   04/23/18 1530  TempSrc:   PainSc: 0-No pain                 Mikiah Durall P Smith Mcnicholas

## 2018-04-23 NOTE — Discharge Instructions (Signed)
Parathyroidectomy A parathyroidectomy is surgery to remove one or more parathyroid glands. These glands are in your neck. They produce a hormone (parathyroid hormone) that helps to control the level of calcium in your body. Each gland is very small, about the size of a pea. Most people have four parathyroid glands. You may have a parathyroidectomy if your body produces too much parathyroid hormone (hyperparathyroidism). This is usually caused by one or more of your parathyroid glands becoming enlarged from a type of noncancerous tumor (adenoma). One of four methods may be used for a parathyroidectomy. An open, minimally invasive, video-assisted, or endoscopic procedure may be done. Some steps of the procedure will vary depending on the method being used. Tell a health care provider about:  Any allergies you have.  All medicines you are taking, including vitamins, herbs, eye drops, creams, and over-the-counter medicines.  Any problems you or family members have had with anesthetic medicines.  Any blood disorders you have.  Any surgeries you have had.  Any medical conditions you have. What are the risks? Generally, this is a safe procedure. However, problems can occur and include:  Excessive bleeding.  Infection near the incision.  Slow healing.  Pooling of blood under the skin in the incision area (hematoma).  Blood clots.  Damage to: ? The nerves in your neck. ? Skin (scarring). ? Surrounding blood vessels.  Need for additional surgery.  A hoarse or weak voice.  A condition in which your body does not make enough parathyroid hormone (hypoparathyroidism). This is rare.  Difficulty breathing. This is rare.  What happens before the procedure?  Ask your health care provider: ? About changing or stopping your regular medicines. This is especially important if you are taking diabetes medicines or blood thinners. ? If you can take medicines such as aspirin and ibuprofen. These  medicines can thin your blood. Do not take these medicines if your health care provider directs you not to.  Do not eat or drink anything after midnight on the night before the procedure or as directed by your health care provider.  You might be asked to shower or wash with an antibacterial soap.  Plan to have someone take you home after the procedure. What happens during the procedure?  An IV tube will be inserted into a vein in your hand or arm. Medicine will flow through this tube directly into your body.  You may be given a medicine to help you relax (sedative).  You will be given a medicine that makes you go to sleep (general anesthetic).  As soon as you are asleep, the surgeon will make the incisions needed for the type of procedure that you are having. ? Open parathyroidectomy. For this procedure, the surgeon will make a single incision in the center of your neck. The incision will be about 2-4 inches long. ? Minimally invasive parathyroidectomy. For this procedure, the surgeon will make one small incision in the side of your neck. This smaller incision will be about 1-2 inches long. Before the procedure, you might be given an injection of a type of medicine that will help the surgeon to locate the gland. ? Video-assisted parathyroidectomy. For this procedure, two small incisions will be made in your neck. One incision is for the instruments that will be used to remove the gland. The other incision is for a tiny camera that helps the surgeon to see inside your neck. ? Endoscopic parathyroidectomy. For this procedure, the surgeon uses a tool that is like  a small, flexible, tubelike telescope (endoscope). The endoscope is inserted through an incision that is made just above your collarbone. This method reduces the scarring and pain of a parathyroidectomy.  The surgeon will remove the gland or glands that are causing problems and then close the incisions using stitches or other methods. The  stitches are often hidden under the skin. What happens after the procedure?  Your blood pressure, heart rate, breathing rate, and blood oxygen level will be monitored often until the medicines you were given have worn off.  Your blood will be tested to check the calcium level in your body.  You may have numbness around your mouth or in your fingers or toes for a day or two after the surgery. This is caused by a low level of calcium. You may have to take calcium supplements. This information is not intended to replace advice given to you by your health care provider. Make sure you discuss any questions you have with your health care provider. Document Released: 09/02/2008 Document Revised: 11/12/2015 Document Reviewed: 10/30/2013 Elsevier Interactive Patient Education  [employer].

## 2018-04-23 NOTE — Interval H&P Note (Signed)
History and Physical Interval Note:  04/23/2018 11:44 AM  Harvie Junior  has presented today for surgery, with the diagnosis of PRIMARY HYPERPARATHYROIDISM. The various methods of treatment have been discussed with the patient and family. After consideration of risks, benefits and other options for treatment, the patient has consented to    Procedure(s): LEFT SUPERIOR AND INFERIOR PARATHYROIDECTOMY (Left) as a surgical intervention .    The patient's history has been reviewed, patient examined, no change in status, stable for surgery.  I have reviewed the patient's chart and labs.  Questions were answered to the patient's satisfaction.    Armandina Gemma, [facility] Office: [place]

## 2018-04-23 NOTE — Anesthesia Procedure Notes (Signed)
Procedure Name: Intubation Date/Time: 04/23/2018 12:07 PM Performed by: Marsa Aris, CRNA Pre-anesthesia Checklist: Patient identified, Emergency Drugs available, Suction available and Patient being monitored Patient Re-evaluated:Patient Re-evaluated prior to induction Oxygen Delivery Method: Circle System Utilized Preoxygenation: Pre-oxygenation with 100% oxygen Induction Type: IV induction Ventilation: Mask ventilation without difficulty Laryngoscope Size: Miller and 2 Grade View: Grade I Tube type: Oral Tube size: 7.0 mm Number of attempts: 1 Airway Equipment and Method: Stylet and Oral airway Placement Confirmation: ETT inserted through vocal cords under direct vision,  positive ETCO2 and breath sounds checked- equal and bilateral Secured at: 22 cm Tube secured with: Tape Dental Injury: Teeth and Oropharynx as per pre-operative assessment  Comments: No change in dentition from pre-procedure

## 2018-04-23 NOTE — Transfer of Care (Signed)
Immediate Anesthesia Transfer of Care Note  Patient: Priscilla Bowers  Procedure(s) Performed: LEFT SUPERIOR AND INFERIOR PARATHYROIDECTOMY (Left Neck)  Patient Location: PACU  Anesthesia Type:General  Level of Consciousness: awake, alert  and oriented  Airway & Oxygen Therapy: Patient Spontanous Breathing and Patient connected to nasal cannula oxygen  Post-op Assessment: Report given to RN and Post -op Vital signs reviewed and stable  Post vital signs: Reviewed and stable  Last Vitals:  Vitals Value Taken Time  BP 142/85 04/23/2018  1:44 PM  Temp    Pulse 78 04/23/2018  1:45 PM  Resp 11 04/23/2018  1:45 PM  SpO2 98 % 04/23/2018  1:45 PM  Vitals shown include unvalidated device data.  Last Pain:  Vitals:   04/23/18 1038  TempSrc:   PainSc: 0-No pain         Complications: No apparent anesthesia complications

## 2018-04-23 NOTE — Progress Notes (Signed)
Inpatient Diabetes Program Recommendations  AACE/ADA: New Consensus Statement on Inpatient Glycemic Control (2019)  Target Ranges:  Prepandial:   less than 140 mg/dL      Peak postprandial:   less than 180 mg/dL (1-2 hours)      Critically ill patients:  140 - 180 mg/dL   Results for IYANNI, HEPP (MRN [id number]) as of 04/23/2018 12:16  Ref. Range 04/23/2018 10:10  Glucose-Capillary Latest Ref Range: 70 - 99 mg/dL 141 (H)  Results for HARBOUR, NORDMEYER (MRN [id number]) as of 04/23/2018 12:16  Ref. Range 04/16/2018 10:18  Glucose Latest Ref Range: 70 - 99 mg/dL 303 (H)  Hemoglobin A1C Latest Ref Range: 4.8 - 5.6 % 10.4 (H)   Review of Glycemic Control  Diabetes history: DM2 Outpatient Diabetes medications: Metformin 500 mg BID Current orders for Inpatient glycemic control: NONE; in OR at this time  Inpatient Diabetes Program Recommendations:  Correction (SSI): If admitted, please consider ordering CBGs with Novolog 0-9 units Q4H for correction. HbgA1C: A1C 10.4% on 04/16/18 indicating an average glucose of 252 mg/dl over the past 2-3 months.   NOTE: Noted consult for Diabetes Coordinator. Patient is currently in OR and plan is to be admitted following surgery. Recommend ordering CBGs with Novolog 0-9 units Q4H while inpatient. Will plan to see patient on 04/24/18 regarding A1C.  Thanks, Barnie Alderman, RN, MSN, CDE Diabetes Coordinator Inpatient Diabetes Program [phone] (Team Pager from 8am to 5pm)

## 2018-04-23 NOTE — Op Note (Signed)
OPERATIVE REPORT - PARATHYROIDECTOMY  Preoperative diagnosis: Primary hyperparathyroidism  Postop diagnosis: Same  Procedure: Left minimally invasive parathyroidectomy  Surgeon:  Armandina Gemma, MD  Anesthesia: General endotracheal  Estimated blood loss: Minimal  Preparation: ChloraPrep  Indications: Patient is referred by Dr. Bud Face for surgical evaluation and management of suspected primary hyperparathyroidism. Patient lives in [place], [place]. Patient has a long-standing history of hypercalcemia. She has undergone workup and evaluation as long as 5 years ago. She had a nuclear medicine parathyroid scan performed in 2014 which failed to demonstrate a parathyroid adenoma. Patient has recently been seen by her endocrinologist in [place]. Laboratory studies from July 2019 showed calcium of 11.1 and an intact PTH level of 130. Patient has had a bone density scan which shows osteoporosis. She has had a 24-hour urine collection performed recently. A vitamin D determination did show vitamin D deficiency and she is currently taking ergocalciferol 50,000 units weekly. Patient is now referred for further evaluation and recommendations regarding primary hyperparathyroidism. 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  Patient now comes to surgery for minimally invasive parathyroidectomy.  Procedure: The patient was prepared in the pre-operative holding area. The patient was brought to the operating room and placed in a supine position on the operating room table. Following administration of general anesthesia, the patient was positioned and then prepped and draped in the usual strict aseptic fashion. After ascertaining that an adequate level of anesthesia been achieved, a neck incision was made with a #15 blade. Dissection was carried through subcutaneous tissues and platysma. Hemostasis was obtained with the electrocautery. Skin flaps were developed  circumferentially and a Weitlander retractor was placed for exposure.  Strap muscles were incised in the midline. Strap muscles were reflected lateralley exposing the thyroid lobe. With gentle blunt dissection the thyroid lobe was mobilized.  Dissection was carried through adipose tissue and an enlarged parathyroid gland was identified. It was gently mobilized. Vascular structures were divided between small ligaclips. Care was taken to avoid the recurrent laryngeal nerve and the esophagus. The parathyroid gland was completely excised. It was submitted to pathology where frozen section confirmed parathyroid tissue consistent with adenoma.  Further dissection throughout the left neck failed to reveal a second adenoma or even a normal parathyroid gland.  Neck was irrigated with warm saline and good hemostasis was noted. Fibrillar was placed in the operative field. Strap muscles were reapproximated in the midline with interrupted 3-0 Vicryl sutures. Platysma was closed with interrupted 3-0 Vicryl sutures. Skin was closed with a running 4-0 Monocryl subcuticular suture. Marcaine was infiltrated circumferentially. Wound was washed and dried and Dermabond was applied. Patient was awakened from anesthesia and brought to the recovery room. The patient tolerated the procedure well.   Armandina Gemma, MD [facility] Office: [phone]

## 2018-04-24 ENCOUNTER — Encounter (HOSPITAL_COMMUNITY): Payer: Self-pay | Admitting: Surgery

## 2018-06-07 ENCOUNTER — Other Ambulatory Visit: Payer: Self-pay | Admitting: "Endocrinology

## 2018-06-18 ENCOUNTER — Other Ambulatory Visit: Payer: Self-pay | Admitting: "Endocrinology

## 2018-06-26 ENCOUNTER — Ambulatory Visit: Payer: Medicare Other | Admitting: "Endocrinology

## 2018-07-31 ENCOUNTER — Encounter: Payer: Self-pay | Admitting: "Endocrinology

## 2018-08-06 ENCOUNTER — Encounter: Payer: Self-pay | Admitting: "Endocrinology

## 2018-08-06 ENCOUNTER — Ambulatory Visit (INDEPENDENT_AMBULATORY_CARE_PROVIDER_SITE_OTHER): Payer: Medicare Other | Admitting: "Endocrinology

## 2018-08-06 VITALS — BP 158/81 | HR 80 | Ht 65.0 in | Wt 185.0 lb

## 2018-08-06 DIAGNOSIS — M810 Age-related osteoporosis without current pathological fracture: Secondary | ICD-10-CM | POA: Diagnosis not present

## 2018-08-06 DIAGNOSIS — E559 Vitamin D deficiency, unspecified: Secondary | ICD-10-CM

## 2018-08-06 DIAGNOSIS — E210 Primary hyperparathyroidism: Secondary | ICD-10-CM

## 2018-08-06 DIAGNOSIS — E038 Other specified hypothyroidism: Secondary | ICD-10-CM | POA: Diagnosis not present

## 2018-08-06 MED ORDER — LEVOTHYROXINE SODIUM 100 MCG PO TABS: 100.0000 ug | ORAL_TABLET | Freq: Every day | ORAL | 6 refills | Status: DC

## 2018-08-06 NOTE — Progress Notes (Signed)
Endocrinology follow-up note   Subjective:    Patient ID: Priscilla Bowers, female    DOB: [birth year], PCP Andres Shad, MD   Past Medical History:  Diagnosis Date  . Diabetes mellitus, type II ([place])   . Dyspnea   . Hyperlipidemia   . Hypertension   . Hypothyroidism    Past Surgical History:  Procedure Laterality Date  . EYE SURGERY    . PARATHYROIDECTOMY Left 04/23/2018   Procedure: LEFT SUPERIOR AND INFERIOR PARATHYROIDECTOMY;  Surgeon: Armandina Gemma, MD;  Location: [facility];  Service: General;  Laterality: Left;  . TONSILLECTOMY    . TUBAL LIGATION     Social History   Socioeconomic History  . Marital status: Unknown    Spouse name: Not on file  . Number of children: Not on file  . Years of education: Not on file  . Highest education level: Not on file  Occupational History  . Not on file  Social Needs  . Financial resource strain: Not on file  . Food insecurity:    Worry: Not on file    Inability: Not on file  . Transportation needs:    Medical: Not on file    Non-medical: Not on file  Tobacco Use  . Smoking status: Never Smoker  . Smokeless tobacco: Never Used  Substance and Sexual Activity  . Alcohol use: No  . Drug use: No  . Sexual activity: Not on file  Lifestyle  . Physical activity:    Days per week: Not on file    Minutes per session: Not on file  . Stress: Not on file  Relationships  . Social connections:    Talks on phone: Not on file    Gets together: Not on file    Attends religious service: Not on file    Active member of club or organization: Not on file    Attends meetings of clubs or organizations: Not on file    Relationship status: Not on file  Other Topics Concern  . Not on file  Social History Narrative  . Not on file   Outpatient Encounter Medications as of 08/06/2018  Medication Sig  . alendronate (FOSAMAX) 70 MG tablet TAKE 1 TABLET BY MOUTH EVERY WEEK TAKE WITH A FULL GLASS OF WATER ON AN EMPTY STOMACH  . amLODipine  (NORVASC) 5 MG tablet Take 5 mg by mouth daily.  . Blood Glucose Monitoring Suppl (ACCU-CHEK GUIDE) w/Device KIT 1 Piece by Does not apply route as directed. (Patient not taking: Reported on 04/11/2018)  . glucose blood (ACCU-CHEK GUIDE) test strip Use as instructed (Patient not taking: Reported on 04/11/2018)  . levothyroxine (SYNTHROID, LEVOTHROID) 100 MCG tablet Take 1 tablet (100 mcg total) by mouth daily before breakfast.  . meclizine (ANTIVERT) 25 MG tablet Take 25 mg by mouth 3 (three) times daily as needed for dizziness.   . metFORMIN (GLUCOPHAGE) 500 MG tablet Take 500 mg by mouth 2 (two) times daily with a meal.   . metoprolol tartrate (LOPRESSOR) 25 MG tablet Take 25 mg by mouth 2 (two) times daily.  . Vitamin D, Ergocalciferol, (DRISDOL) 50000 units CAPS capsule Take 1 capsule by mouth every week (Patient taking differently: Take 50,000 Units by mouth every Sunday. )  . [DISCONTINUED] levothyroxine (SYNTHROID, LEVOTHROID) 88 MCG tablet TAKE 1 TABLET (88 MCG TOTAL) BY MOUTH DAILY BEFORE BREAKFAST.  . [DISCONTINUED] Magnesium Oxide 400 (240 Mg) MG TABS Take 1 tablet (400 mg total) by mouth 2 (two) times daily with  a meal. (Patient not taking: Reported on 04/13/2018)  . [DISCONTINUED] traMADol (ULTRAM) 50 MG tablet Take 1-2 tablets (50-100 mg total) by mouth every 6 (six) hours as needed for moderate pain or severe pain.   No facility-administered encounter medications on file as of 08/06/2018.    ALLERGIES: No Known Allergies VACCINATION STATUS:  There is no immunization history on file for this patient.  HPI 68 -year-old female patient with medical history as above. She is being seen follow-up for primary hyperparathyroidism complicated by hypercalcemia, osteoporosis -she is status post parathyroidectomy on April 23, 2018 which revealed left-sided parathyroid adenoma.  Her PTH has improved to 62 along with normalized calcium of 9.1 on July 31, 2018.  She also has hypothyroidism  on levothyroxine. -She reports feeling better, has no new complaints today.  -He continues to tolerate her treatment for osteoporosis with alendronate 70 mg p.o. weekly. She denies family history of hypercalcemia requiring parathyroidectomy. -Her daily dairy intake is average. - She denies history of nephrolithiasis, seizure disorders, however her significant osteoporosis on treatment with alendronate 70 mg by mouth weekly. She is tolerating this medication very well.  - She also have chronically uncontrolled type 2 diabetes on treatment with metformin and Amaryl. She does  not monitor blood glucose regularly.  She follows with her PMD for diabetes care.    - She has no new complaints today.   Review of Systems   Constitutional: + Progressive weight gain,  no fatigue, no subjective hyperthermia/hypothermia Eyes: no blurry vision, no xerophthalmia ENT: no sore throat, no nodules palpated in throat, no dysphagia/odynophagia, no hoarseness Cardiovascular: no chest pain, no shortness of breath, no palpitations.   Musculoskeletal: no muscle/joint aches Skin: no rashes Neurological: no tremors/numbness/tingling/dizziness Psychiatric: no depression/anxiety  Objective:    BP (!) 158/81   Pulse 80   Ht _0  (1.651 m)   Wt 185 lb (83.9 kg)   BMI 30.79 kg/m   Wt Readings from Last 3 Encounters:  08/06/18 185 lb (83.9 kg)  04/23/18 182 lb (82.6 kg)  04/16/18 179 lb 12.8 oz (81.6 kg)    Physical Exam  Constitutional: + Obese for height, not in acute distress.    Eyes: PERRLA, EOMI, no exophthalmos ENT: moist mucous membranes, no thyromegaly, no cervical lymphadenopathy Musculoskeletal: No gross deformities strength intact in all 4 Skin: moist, warm, no rashes Neurological: no tremor with outstretched hands, DTR normal in all 4  July 31, 2018 labs: PTH 62, calcium 9.1   Assessment & Plan:   1. Hyperparathyroidism-status post parathyroidectomy with benign left sided  parathyroid adenoma on April 23, 2018. -Primary hyperparathyroidism has resolved.  She will continue to need observation with repeat PTH/calcium in 6 months. 2. Osteoporosis: -  Her DEXA   Scan FROM AUGUST 2017 showed significant bone loss on spine and bilateral hips.  She is tolerating her treatment with  Fosamax 70 mg by mouth weekly.  She will need repeat bone density before her next visit in 6 months.   3.  hypothyroidism -Based on her previsit thyroid function tests, she would benefit from increased levothyroxine dose.  I discussed and increase her levothyroxine to 100 mcg p.o. every morning.   - We discussed about the correct intake of her thyroid hormone, on empty stomach at fasting, with water, separated by at least 30 minutes from breakfast and other medications,  and separated by more than 4 hours from calcium, iron, multivitamins, acid reflux medications (PPIs). -Patient is made aware of the fact that  thyroid hormone replacement is needed for life, dose to be adjusted by periodic monitoring of thyroid function tests.   4. Type 2 diabetes: -No recent A1c, she wishes to continue follow-up with her PMD for diabetes care.  She is currently on Amaryl and metformin.    - Patient admits there is a room for improvement in her diet and drink choices. -  Suggestion is made for her to avoid simple carbohydrates  from her diet including Cakes, Sweet Desserts / Pastries, Ice Cream, Soda (diet and regular), Sweet Tea, Candies, Chips, Cookies, Store Bought Juices, Alcohol in Excess of  1-2 drinks a day, Artificial Sweeteners, and "Sugar-free" Products. This will help patient to have stable blood glucose profile and potentially avoid unintended weight gain.    - I advised patient to maintain close follow up with Andres Shad, MD for primary care needs. Follow up plan: Return in about 6 months (around 02/04/2019) for Follow up with Pre-visit Labs, Follow up with Bone Density.  Glade Lloyd, MD Phone: [phone]  Fax: [phone]   08/06/2018, 11:39 AM

## 2018-09-19 ENCOUNTER — Other Ambulatory Visit: Payer: Self-pay | Admitting: "Endocrinology

## 2018-11-19 ENCOUNTER — Other Ambulatory Visit: Payer: Self-pay | Admitting: "Endocrinology

## 2019-02-01 ENCOUNTER — Other Ambulatory Visit: Payer: Self-pay | Admitting: "Endocrinology

## 2019-02-04 ENCOUNTER — Ambulatory Visit: Payer: Medicare Other | Admitting: "Endocrinology

## 2019-04-10 ENCOUNTER — Other Ambulatory Visit: Payer: Self-pay | Admitting: "Endocrinology

## 2019-05-06 ENCOUNTER — Other Ambulatory Visit: Payer: Self-pay | Admitting: "Endocrinology

## 2019-05-31 ENCOUNTER — Other Ambulatory Visit: Payer: Self-pay | Admitting: "Endocrinology

## 2019-06-25 ENCOUNTER — Other Ambulatory Visit: Payer: Self-pay | Admitting: "Endocrinology

## 2019-07-27 IMAGING — CR DG CHEST 2V
2 series · 2 of 2 positions shown · non-contrast
Comparison: None.

CLINICAL DATA: Preoperative chest radiograph prior to parathyroid
surgery.

EXAM:
CHEST - 2 VIEW

[w chest pa]
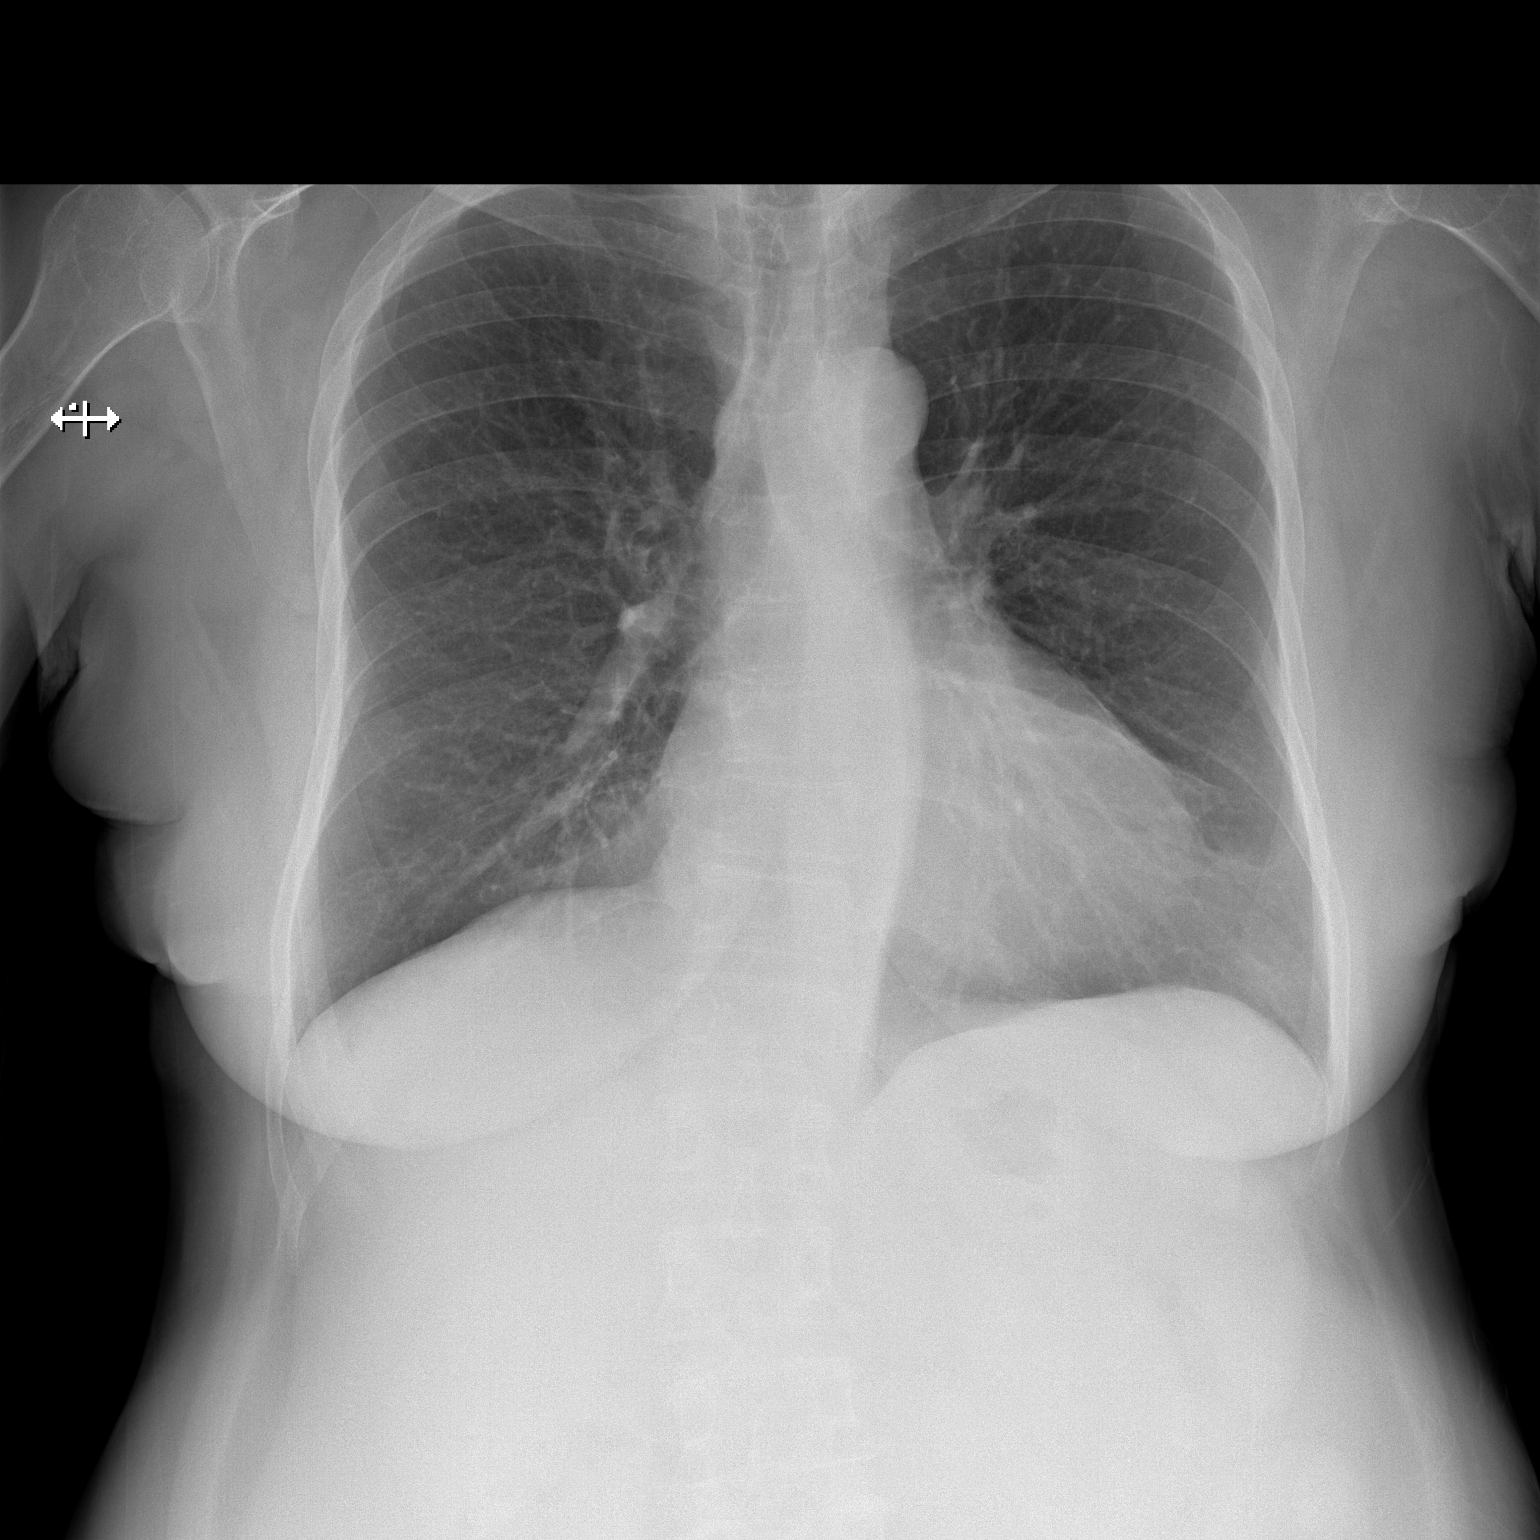

[w chest lat]
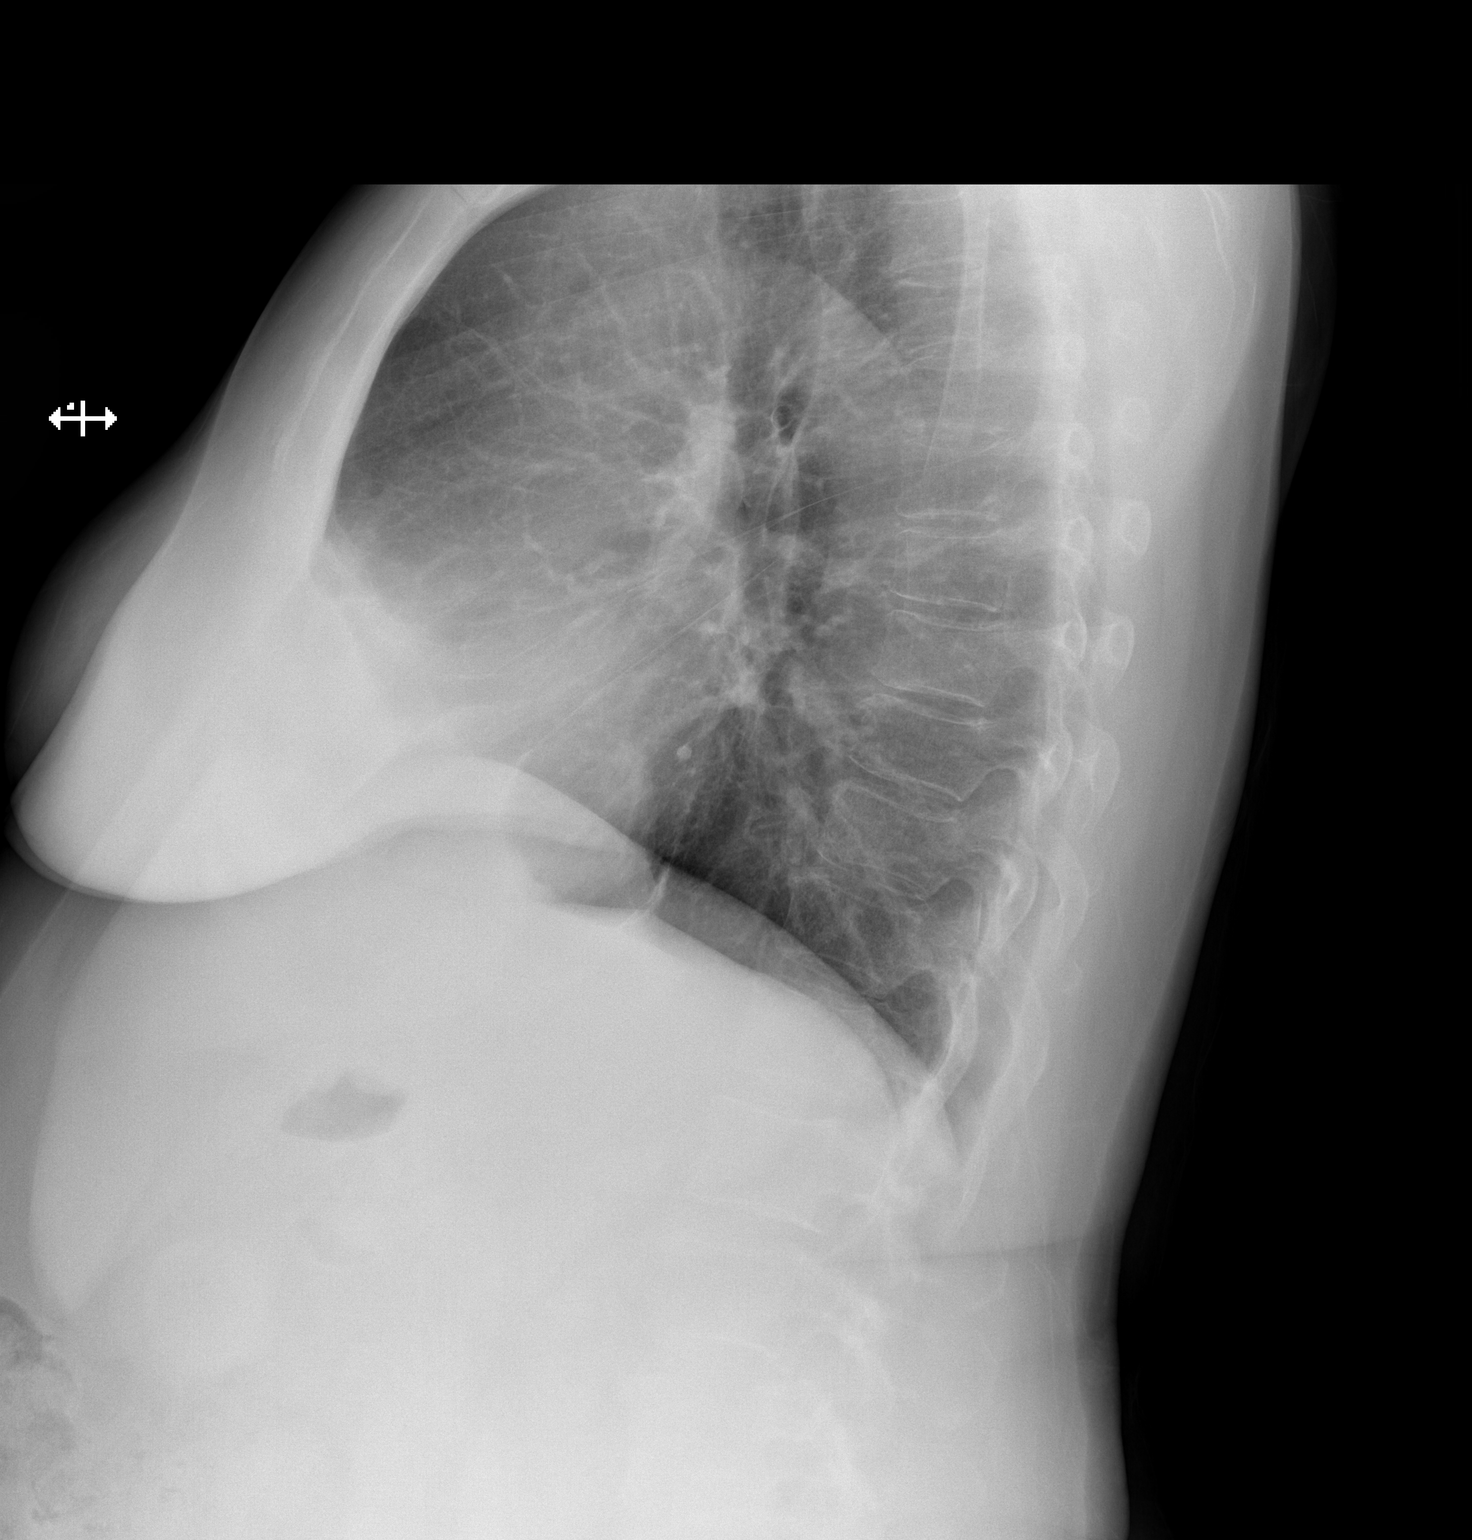

[2 of 2 positions shown; findings below may reference images not displayed]

FINDINGS: The cardiomediastinal silhouette is unremarkable.

There is no evidence of focal airspace disease, pulmonary edema,
suspicious pulmonary nodule/mass, pleural effusion, or pneumothorax.

No acute bony abnormalities are identified.
IMPRESSION: No active cardiopulmonary disease.

## 2021-04-27 ENCOUNTER — Telehealth: Payer: Self-pay | Admitting: "Endocrinology

## 2021-04-27 NOTE — Telephone Encounter (Signed)
Patient called to sch. Advised her to get a new referral since her last office visit was 2019

## 2021-09-29 LAB — LIPID PANEL
Cholesterol: 235 — AB (ref 0–200)
HDL: 33 — AB (ref 35–70)
LDL Cholesterol: 82
Triglycerides: 744 — AB (ref 40–160)

## 2021-09-29 LAB — BASIC METABOLIC PANEL
BUN: 12 (ref 4–21)
CO2: 19 (ref 13–22)
Chloride: 99 (ref 99–108)
Creatinine: 0.8 (ref 0.5–1.1)
Glucose: 295
Potassium: 4.4 mEq/L (ref 3.5–5.1)
Sodium: 134 — AB (ref 137–147)

## 2021-09-29 LAB — HEPATIC FUNCTION PANEL
ALT: 7 U/L (ref 7–35)
AST: 9 — AB (ref 13–35)
Alkaline Phosphatase: 57 (ref 25–125)
Bilirubin, Total: 0.3

## 2021-09-29 LAB — COMPREHENSIVE METABOLIC PANEL
Albumin: 4 (ref 3.5–5.0)
Calcium: 9 (ref 8.7–10.7)
Globulin: 2.8

## 2021-09-29 LAB — HEMOGLOBIN A1C: Hemoglobin A1C: 15.4

## 2021-12-01 ENCOUNTER — Ambulatory Visit: Payer: Medicare Other | Admitting: "Endocrinology

## 2021-12-02 ENCOUNTER — Encounter: Payer: Self-pay | Admitting: "Endocrinology

## 2021-12-02 ENCOUNTER — Ambulatory Visit (INDEPENDENT_AMBULATORY_CARE_PROVIDER_SITE_OTHER): Payer: Medicare (Managed Care) | Admitting: "Endocrinology

## 2021-12-02 VITALS — BP 138/80 | HR 88 | Ht 66.0 in | Wt 173.0 lb

## 2021-12-02 DIAGNOSIS — E1165 Type 2 diabetes mellitus with hyperglycemia: Secondary | ICD-10-CM

## 2021-12-02 DIAGNOSIS — E039 Hypothyroidism, unspecified: Secondary | ICD-10-CM

## 2021-12-02 DIAGNOSIS — E782 Mixed hyperlipidemia: Secondary | ICD-10-CM

## 2021-12-02 DIAGNOSIS — I10 Essential (primary) hypertension: Secondary | ICD-10-CM | POA: Diagnosis not present

## 2021-12-02 DIAGNOSIS — Z794 Long term (current) use of insulin: Secondary | ICD-10-CM

## 2021-12-02 MED ORDER — GLIPIZIDE ER 5 MG PO TB24: 5.0000 mg | ORAL_TABLET | Freq: Every day | ORAL | 3 refills | Status: DC

## 2021-12-02 NOTE — Progress Notes (Signed)
Endocrinology Consult Note       12/02/2021, 4:57 PM   Subjective:    Patient ID: Priscilla Bowers, female    DOB: 05-22-1951.  Priscilla Bowers is being seen in consultation for management of currently uncontrolled symptomatic diabetes requested by  Andres Shad, MD.   Past Medical History:  Diagnosis Date   Asthma    Diabetes mellitus, type II ([place])    Dyspnea    Hyperlipidemia    Hypertension    Hypothyroidism     Past Surgical History:  Procedure Laterality Date   EYE SURGERY     PARATHYROIDECTOMY Left 04/23/2018   Procedure: LEFT SUPERIOR AND INFERIOR PARATHYROIDECTOMY;  Surgeon: Armandina Gemma, MD;  Location: [facility] OR;  Service: General;  Laterality: Left;   TONSILLECTOMY     TUBAL LIGATION      Social History   Socioeconomic History   Marital status: Unknown    Spouse name: Not on file   Number of children: Not on file   Years of education: Not on file   Highest education level: Not on file  Occupational History   Not on file  Tobacco Use   Smoking status: Never   Smokeless tobacco: Never  Vaping Use   Vaping Use: Never used  Substance and Sexual Activity   Alcohol use: No   Drug use: No   Sexual activity: Not on file  Other Topics Concern   Not on file  Social History Narrative   Not on file   Social Determinants of Health   Financial Resource Strain: Not on file  Food Insecurity: Not on file  Transportation Needs: Not on file  Physical Activity: Not on file  Stress: Not on file  Social Connections: Not on file    Family History  Problem Relation Age of Onset   Hypertension Mother    Diabetes Mother    Heart attack Mother    Cancer Mother     Outpatient Encounter Medications as of 12/02/2021  Medication Sig   glipiZIDE (GLUCOTROL XL) 5 MG 24 hr tablet Take 1 tablet (5 mg total) by mouth daily with breakfast.   omeprazole (PRILOSEC) 40 MG capsule Take 40 mg by  mouth daily.   alendronate (FOSAMAX) 70 MG tablet TAKE ONE (1) TABLET BY MOUTH EVERY WEEK  WITH A FULL GLASS OF WATER ON AN EMPTY STOMACH    amLODipine (NORVASC) 5 MG tablet Take 5 mg by mouth daily.   Blood Glucose Monitoring Suppl (ACCU-CHEK GUIDE) w/Device KIT 1 Piece by Does not apply route as directed. (Patient not taking: Reported on 04/11/2018)   glucose blood (ACCU-CHEK GUIDE) test strip Use as instructed (Patient not taking: Reported on 04/11/2018)   LANTUS SOLOSTAR 100 UNIT/ML Solostar Pen Inject 20 Units into the skin at bedtime.   levothyroxine (SYNTHROID) 100 MCG tablet TAKE ONE (1) TABLET (100 MCG TOTAL) BY MOUTH DAILY BEFORE BREAKFAST.    losartan (COZAAR) 50 MG tablet Take 50 mg by mouth daily.   meclizine (ANTIVERT) 25 MG tablet Take 25 mg by mouth 3 (three) times daily as needed for dizziness.    metFORMIN (GLUCOPHAGE) 500 MG tablet  Take 500 mg by mouth 2 (two) times daily with a meal.    metoprolol tartrate (LOPRESSOR) 25 MG tablet Take 25 mg by mouth 2 (two) times daily.   Vitamin D, Ergocalciferol, (DRISDOL) 1.25 MG (50000 UT) CAPS capsule TAKE 1 CAPSULE BY MOUTH EVERY WEEK   [DISCONTINUED] glimepiride (AMARYL) 4 MG tablet Take 4 mg by mouth daily.   No facility-administered encounter medications on file as of 12/02/2021.    ALLERGIES: No Known Allergies  VACCINATION STATUS:  There is no immunization history on file for this patient.  Diabetes She presents for her initial diabetic visit. She has type 2 diabetes mellitus. Onset time: she was diagnosed at approx age of 76 yrs. Disease course: this patient was previously seen in consult for hyperparathyroidism, hypothyroidism, osteoporosis. There are no hypoglycemic associated symptoms. Pertinent negatives for hypoglycemia include no confusion, headaches, pallor or seizures. Associated symptoms include fatigue, polydipsia and polyuria. Pertinent negatives for diabetes include no chest pain and no polyphagia. There are no  hypoglycemic complications. Symptoms are worsening. Risk factors for coronary artery disease include hypertension, obesity, family history, sedentary lifestyle and post-menopausal. Current diabetic treatment includes insulin injections (she is on Lantus 13 units, glimepiride 4 mg po BID, and mtf 500 mg x 2). Her weight is fluctuating minimally. She is following a generally unhealthy diet. When asked about meal planning, she reported none. She has not had a previous visit with a dietitian. She never participates in exercise. (She did not bring any meter nor logs , but reports high readings. Her recent a1c was 15.4%. ) An ACE inhibitor/angiotensin II receptor blocker is being taken.  Hypertension This is a chronic problem. The current episode started more than 1 year ago. The problem is controlled. Pertinent negatives include no chest pain, headaches, palpitations or shortness of breath. Risk factors for coronary artery disease include diabetes mellitus, obesity, sedentary lifestyle and post-menopausal state. Past treatments include ACE inhibitors.  Hyperlipidemia This is a chronic problem. The current episode started more than 1 year ago. Exacerbating diseases include diabetes and hypothyroidism. Pertinent negatives include no chest pain, myalgias or shortness of breath. Risk factors for coronary artery disease include dyslipidemia, diabetes mellitus, hypertension, obesity, a sedentary lifestyle and post-menopausal.     Review of Systems  Constitutional:  Positive for fatigue. Negative for chills, fever and unexpected weight change.  HENT:  Negative for trouble swallowing and voice change.   Eyes:  Negative for visual disturbance.  Respiratory:  Negative for cough, shortness of breath and wheezing.   Cardiovascular:  Negative for chest pain, palpitations and leg swelling.  Gastrointestinal:  Negative for diarrhea, nausea and vomiting.  Endocrine: Positive for polydipsia and polyuria. Negative for cold  intolerance, heat intolerance and polyphagia.  Musculoskeletal:  Negative for arthralgias and myalgias.  Skin:  Negative for color change, pallor, rash and wound.  Neurological:  Negative for seizures and headaches.  Psychiatric/Behavioral:  Negative for confusion and suicidal ideas.     Objective:       12/02/2021    9:43 AM 08/06/2018   10:10 AM 04/23/2018    3:30 PM  Vitals with BMI  Height 5' 6"  5' 5"    Weight 173 lbs 185 lbs   BMI 73.22 02.54   Systolic 270 623 762  Diastolic 80 81 92  Pulse 88 80 85    BP 138/80   Pulse 88   Ht 5' 6"  (1.676 m)   Wt 173 lb (78.5 kg)   BMI 27.92 kg/m  Wt Readings from Last 3 Encounters:  12/02/21 173 lb (78.5 kg)  08/06/18 185 lb (83.9 kg)  04/23/18 182 lb (82.6 kg)     Physical Exam Constitutional:      Appearance: She is well-developed.  HENT:     Head: Normocephalic and atraumatic.  Neck:     Thyroid: No thyromegaly.     Trachea: No tracheal deviation.  Cardiovascular:     Rate and Rhythm: Normal rate and regular rhythm.  Pulmonary:     Effort: Pulmonary effort is normal.  Abdominal:     General: Bowel sounds are normal.     Palpations: Abdomen is soft.     Tenderness: There is no abdominal tenderness. There is no guarding.  Musculoskeletal:        General: Normal range of motion.     Cervical back: Normal range of motion and neck supple.  Skin:    General: Skin is warm and dry.     Coloration: Skin is not pale.     Findings: No erythema or rash.  Neurological:     Mental Status: She is alert and oriented to person, place, and time.     Cranial Nerves: No cranial nerve deficit.     Coordination: Coordination normal.     Deep Tendon Reflexes: Reflexes are normal and symmetric.  Psychiatric:        Judgment: Judgment normal.     Comments: Possible cognitive deficit    CMP ( most recent) CMP     Component Value Date/Time   NA 134 (A) 09/29/2021 0000   K 4.4 09/29/2021 0000   CL 99 09/29/2021 0000   CO2 19  09/29/2021 0000   GLUCOSE 303 (H) 04/16/2018 1018   BUN 12 09/29/2021 0000   CREATININE 0.8 09/29/2021 0000   CREATININE 0.83 04/16/2018 1018   CALCIUM 9.0 09/29/2021 0000   ALBUMIN 4.0 09/29/2021 0000   AST 9 (A) 09/29/2021 0000   ALT 7 09/29/2021 0000   ALKPHOS 57 09/29/2021 0000   GFRNONAA >60 04/16/2018 1018   GFRAA >60 04/16/2018 1018     Diabetic Labs (most recent): Lab Results  Component Value Date   HGBA1C 15.4 09/29/2021   HGBA1C 10.4 (H) 04/16/2018   HGBA1C 9.4 06/08/2017     Lipid Panel ( most recent) Lipid Panel     Component Value Date/Time   CHOL 235 (A) 09/29/2021 0000   TRIG 744 (A) 09/29/2021 0000   HDL 33 (A) 09/29/2021 0000   LDLCALC 82 09/29/2021 0000      Lab Results  Component Value Date   TSH 2.41 06/08/2017           Assessment & Plan:   1. Type 2 diabetes mellitus with hyperglycemia, with long-term current use of insulin (HCC)   - Priscilla Bowers has currently uncontrolled symptomatic type 2 DM since  71 years of age,  with most recent A1c of 15.4 %. Recent labs reviewed. - I had a long discussion with her about the progressive nature of diabetes and the pathology behind its complications. -her diabetes is complicated by comorbid conditions including HTN, HPL, sedentary life and she remains at a high risk for more acute and chronic complications which include CAD, CVA, CKD, retinopathy, and neuropathy. These are all discussed in detail with her.  - I discussed all available options of managing her diabetes including de-escalation of medications. I have counseled her on diet  and weight management  by adopting a Whole Food , Plant  Predominant  ( WFPP) nutrition as recommended by American College of Lifestyle Medicine. Patient is encouraged to switch to  unprocessed or minimally processed  complex starch, adequate protein intake (mainly plant source), minimal liquid fat ( mainly vegetable oils), plenty of fruits, and vegetables. -  she is  advised to stick to a routine mealtimes to eat 3 complete meals a day and snack only when necessary ( to snack only to correct hypoglycemia BG <70 day time or <100 at night).   - she acknowledges that there is a room for improvement in her food and drink choices. - Further Specific Suggestion is made for her to avoid simple carbohydrates  from her diet including Cakes, Sweet Desserts, Ice Cream, Soda (diet and regular), Sweet Tea, Candies, Chips, Cookies, Store Bought Juices, Alcohol ,  Artificial Sweeteners,  Coffee Creamer, and "Sugar-free" Products. This will help patient to have more stable blood glucose profile and potentially avoid unintended weight gain.  The following Lifestyle Medicine recommendations according to [facility]) were discussed and offered to patient and she agrees to start the journey:  A. Whole Foods, Plant-based plate comprising of fruits and vegetables, plant-based proteins, whole-grain carbohydrates was discussed in detail with the patient.   A list for source of those nutrients were also provided to the patient.  Patient will use only water or unsweetened tea for hydration. B.  The need to stay away from risky substances including alcohol, smoking; obtaining 7 to 9 hours of restorative sleep, at least 150 minutes of moderate intensity exercise weekly, the importance of healthy social connections,  and stress reduction techniques were discussed. C.  A full color page of  Calorie density of various food groups per pound showing examples of each food groups was provided to the patient.  - she will be scheduled with Jearld Fenton, RDN, CDE for individualized diabetes education.  - I have approached her with the following plan to manage  her diabetes and patient agrees:   - she is advised to increase her Lantus to 20 units daily at bedtime , and approached to monitor 4 times a day-before meals and at bedtime and return in 10 days for  re-eval. She will be considered for prandial insulin if she presents with significant post prandial hyperglycemia. She will benefit from a CGM, she will be considered. - she is warned not to take insulin without proper monitoring per orders.  - she is encouraged to call clinic for blood glucose levels less than 70 or above 300 mg /dl. - she is advised to continue Metformin 517m po BID, therapeutically suitable for patient . I discussed and changed her glimepiride to glipizide 552mxl po at breakfast. - Specific targets for  A1c;  LDL, HDL,  and Triglycerides were discussed with the patient.  2) Blood Pressure /Hypertension:  her blood pressure is controlled to target.   she is advised to continue her current medications including Losartan 50 mg p.o. daily with breakfast . 3) Lipids/Hyperlipidemia:   Review of her recent lipid panel showed   controlled  LDL at 52.  she  is a candidate for WFPB , not on a statin, will be approached for WFPB diet next visit.   4)  Weight/Diet:  Body mass index is 27.92 kg/m.  -    she is a candidate for some  weight loss. I discussed with her the fact that loss of 5 - 10% of her  current body weight will have the  most impact on her diabetes management.  The above detailed  ACLM recommendations for nutrition, exercise, sleep, social life, avoidance of risky substances, the need for restorative sleep   information will also detailed on discharge instructions.  5) Hypothyroidism: she is on levothyroxine 100 mcg po qam.  - We discussed about the correct intake of her thyroid hormone, on empty stomach at fasting, with water, separated by at least 30 minutes from breakfast and other medications,  and separated by more than 4 hours from calcium, iron, multivitamins, acid reflux medications (PPIs). -Patient is made aware of the fact that thyroid hormone replacement is needed for life, dose to be adjusted by periodic monitoring of thyroid function tests.   6)  Osteoporosis: She is on alendronate 70 mg po weekly. She did not have recent bone density. She is advised to continue. She will be considered for repeat DXA after her next  visit.  7) Chronic Care/Health Maintenance:  -she  is on ARB  medications and  is encouraged to initiate and continue to follow up with Ophthalmology, Dentist,  Podiatrist at least yearly or according to recommendations, and advised to  stay away from smoking. I have recommended yearly flu vaccine and pneumonia vaccine at least every 5 years; moderate intensity exercise for up to 150 minutes weekly; and  sleep for 7- 9 hours a day.  - she is  advised to maintain close follow up with Andres Shad, MD for primary care needs, as well as her other providers for optimal and coordinated care.   I spent 60 minutes in the care of the patient today including review of labs from [facility], Lipids, Thyroid Function, Hematology (current and previous including abstractions from other facilities); face-to-face time discussing  her blood glucose readings/logs, discussing hypoglycemia and hyperglycemia episodes and symptoms, medications doses, her options of short and long term treatment based on the latest standards of care / guidelines;  discussion about incorporating lifestyle medicine;  and documenting the encounter.     Please refer to Patient Instructions for Blood Glucose Monitoring and Insulin/Medications Dosing Guide"  in media tab for additional information. Please  also refer to " Patient Self Inventory" in the Media  tab for reviewed elements of pertinent patient history.  Harvie Junior participated in the discussions, expressed understanding, and voiced agreement with the above plans.  All questions were answered to her satisfaction. she is encouraged to contact clinic should she have any questions or concerns prior to her return visit.   Follow up plan: - Return in about 10 days (around 12/12/2021) for F/U with Meter/CGM /Logs  Only - no Labs.  Glade Lloyd, MD [facility] [address] [place], [place] [address] Phone: [phone]  Fax: [phone]    12/02/2021, 4:57 PM  This note was partially dictated with voice recognition software. Similar sounding words can be transcribed inadequately or may not  be corrected upon review.

## 2021-12-02 NOTE — Patient Instructions (Signed)
                                     Advice for Weight Management  -For most of us the best way to lose weight is by diet management. Generally speaking, diet management means consuming less calories intentionally which over time brings about progressive weight loss.  This can be achieved more effectively by avoiding ultra processed carbohydrates, processed meats, unhealthy fats.    It is critically important to know your numbers: how much calorie you are consuming and how much calorie you need. More importantly, our carbohydrates sources should be unprocessed naturally occurring  complex starch food items.  It is always important to balance nutrition also by  appropriate intake of proteins (mainly plant-based), healthy fats/oils, plenty of fruits and vegetables.   -The American College of Lifestyle Medicine (ACL M) recommends nutrition derived mostly from Whole Food, Plant Predominant Sources example an apple instead of applesauce or apple pie. Eat Plenty of vegetables, Mushrooms, fruits, Legumes, Whole Grains, Nuts, seeds in lieu of processed meats, processed snacks/pastries red meat, poultry, eggs.  Use only water or unsweetened tea for hydration.  The College also recommends the need to stay away from risky substances including alcohol, smoking; obtaining 7-9 hours of restorative sleep, at least 150 minutes of moderate intensity exercise weekly, importance of healthy social connections, and being mindful of stress and seek help when it is overwhelming.    -Sticking to a routine mealtime to eat 3 meals a day and avoiding unnecessary snacks is shown to have a big role in weight control. Under normal circumstances, the only time we burn stored energy is when we are hungry, so allow  some hunger to take place- hunger means no food between appropriate meal times, only water.  It is not advisable to starve.   -It is better to avoid simple carbohydrates including:  Cakes, Sweet Desserts, Ice Cream, Soda (diet and regular), Sweet Tea, Candies, Chips, Cookies, Store Bought Juices, Alcohol in Excess of  1-2 drinks a day, Lemonade,  Artificial Sweeteners, Doughnuts, Coffee Creamers, "Sugar-free" Products, etc, etc.  This is not a complete list.....    -Consulting with certified diabetes educators is proven to provide you with the most accurate and current information on diet.  Also, you may be  interested in discussing diet options/exchanges , we can schedule a visit with Priscilla Bowers, RDN, CDE for individualized nutrition education.  -Exercise: If you are able: 30 -60 minutes a day ,4 days a week, or 150 minutes of moderate intensity exercise weekly.    The longer the better if tolerated.  Combine stretch, strength, and aerobic activities.  If you were told in the past that you have high risk for cardiovascular diseases, or if you are currently symptomatic, you may seek evaluation by your heart doctor prior to initiating moderate to intense exercise programs.                                  Additional Care Considerations for Diabetes   -Diabetes  is a chronic disease.  The most important care consideration is regular follow-up with your diabetes care provider with the goal being avoiding or delaying its complications and to take advantage of advances in medications and technology.  If appropriate actions are taken early enough, type 2 diabetes can even be   reversed.  Seek information from the right source.  - Whole Food, Plant Predominant Nutrition is highly recommended: Eat Plenty of vegetables, Mushrooms, fruits, Legumes, Whole Grains, Nuts, seeds in lieu of processed meats, processed snacks/pastries red meat, poultry, eggs as recommended by American College of  Lifestyle Medicine (ACLM).  -Type 2 diabetes is known to coexist with other important comorbidities such as high blood pressure and high cholesterol.  It is critical to control not only the diabetes but  also the high blood pressure and high cholesterol to minimize and delay the risk of complications including coronary artery disease, stroke, amputations, blindness, etc.  The good news is that this diet recommendation for type 2 diabetes is also very helpful for managing high cholesterol and high blood blood pressure.  - Studies showed that people with diabetes will benefit from a class of medications known as ACE inhibitors and statins.  Unless there are specific reasons not to be on these medications, the standard of care is to consider getting one from these groups of medications at an optimal doses.  These medications are generally considered safe and proven to help protect the heart and the kidneys.    - People with diabetes are encouraged to initiate and maintain regular follow-up with eye doctors, foot doctors, dentists , and if necessary heart and kidney doctors.     - It is highly recommended that people with diabetes quit smoking or stay away from smoking, and get yearly  flu vaccine and pneumonia vaccine at least every 5 years.  See above for additional recommendations on exercise, sleep, stress management , and healthy social connections.      

## 2021-12-15 ENCOUNTER — Ambulatory Visit (INDEPENDENT_AMBULATORY_CARE_PROVIDER_SITE_OTHER): Payer: Medicare (Managed Care) | Admitting: "Endocrinology

## 2021-12-15 ENCOUNTER — Encounter: Payer: Self-pay | Admitting: "Endocrinology

## 2021-12-15 VITALS — BP 122/78 | HR 96 | Ht 66.0 in | Wt 171.0 lb

## 2021-12-15 DIAGNOSIS — I10 Essential (primary) hypertension: Secondary | ICD-10-CM | POA: Diagnosis not present

## 2021-12-15 DIAGNOSIS — E039 Hypothyroidism, unspecified: Secondary | ICD-10-CM

## 2021-12-15 DIAGNOSIS — E1165 Type 2 diabetes mellitus with hyperglycemia: Secondary | ICD-10-CM | POA: Diagnosis not present

## 2021-12-15 DIAGNOSIS — E782 Mixed hyperlipidemia: Secondary | ICD-10-CM

## 2021-12-15 DIAGNOSIS — Z794 Long term (current) use of insulin: Secondary | ICD-10-CM

## 2021-12-15 NOTE — Patient Instructions (Signed)

## 2021-12-15 NOTE — Progress Notes (Signed)
Endocrinology Consult Note       12/15/2021, 1:41 PM   Subjective:    Patient ID: Priscilla Bowers, female    DOB: 04-11-1951.  Priscilla Bowers is being seen in consultation for management of currently uncontrolled symptomatic diabetes requested by  Andres Shad, MD.   Past Medical History:  Diagnosis Date   Asthma    Diabetes mellitus, type II ([place])    Dyspnea    Hyperlipidemia    Hypertension    Hypothyroidism     Past Surgical History:  Procedure Laterality Date   EYE SURGERY     PARATHYROIDECTOMY Left 04/23/2018   Procedure: LEFT SUPERIOR AND INFERIOR PARATHYROIDECTOMY;  Surgeon: Armandina Gemma, MD;  Location: [facility] OR;  Service: General;  Laterality: Left;   TONSILLECTOMY     TUBAL LIGATION      Social History   Socioeconomic History   Marital status: Unknown    Spouse name: Not on file   Number of children: Not on file   Years of education: Not on file   Highest education level: Not on file  Occupational History   Not on file  Tobacco Use   Smoking status: Never   Smokeless tobacco: Never  Vaping Use   Vaping Use: Never used  Substance and Sexual Activity   Alcohol use: No   Drug use: No   Sexual activity: Not on file  Other Topics Concern   Not on file  Social History Narrative   Not on file   Social Determinants of Health   Financial Resource Strain: Not on file  Food Insecurity: Not on file  Transportation Needs: Not on file  Physical Activity: Not on file  Stress: Not on file  Social Connections: Not on file    Family History  Problem Relation Age of Onset   Hypertension Mother    Diabetes Mother    Heart attack Mother    Cancer Mother     Outpatient Encounter Medications as of 12/15/2021  Medication Sig   alendronate (FOSAMAX) 70 MG tablet TAKE ONE (1) TABLET BY MOUTH EVERY WEEK  WITH A FULL GLASS OF WATER ON AN EMPTY STOMACH    amLODipine (NORVASC) 5 MG  tablet Take 5 mg by mouth daily.   Blood Glucose Monitoring Suppl (ACCU-CHEK GUIDE) w/Device KIT 1 Piece by Does not apply route as directed. (Patient not taking: Reported on 04/11/2018)   glipiZIDE (GLUCOTROL XL) 5 MG 24 hr tablet Take 1 tablet (5 mg total) by mouth daily with breakfast.   glucose blood (ACCU-CHEK GUIDE) test strip Use as instructed (Patient not taking: Reported on 04/11/2018)   LANTUS SOLOSTAR 100 UNIT/ML Solostar Pen Inject 20 Units into the skin at bedtime.   levothyroxine (SYNTHROID) 100 MCG tablet TAKE ONE (1) TABLET (100 MCG TOTAL) BY MOUTH DAILY BEFORE BREAKFAST.    losartan (COZAAR) 50 MG tablet Take 50 mg by mouth daily.   meclizine (ANTIVERT) 25 MG tablet Take 25 mg by mouth 3 (three) times daily as needed for dizziness.    metFORMIN (GLUCOPHAGE) 500 MG tablet Take 500 mg by mouth 2 (two) times daily with a meal.  metoprolol tartrate (LOPRESSOR) 25 MG tablet Take 25 mg by mouth 2 (two) times daily.   omeprazole (PRILOSEC) 40 MG capsule Take 40 mg by mouth daily.   Vitamin D, Ergocalciferol, (DRISDOL) 1.25 MG (50000 UT) CAPS capsule TAKE 1 CAPSULE BY MOUTH EVERY WEEK   No facility-administered encounter medications on file as of 12/15/2021.    ALLERGIES: No Known Allergies  VACCINATION STATUS:  There is no immunization history on file for this patient.  Diabetes She presents for her follow-up diabetic visit. She has type 2 diabetes mellitus. Onset time: she was diagnosed at approx age of 75 yrs. Her disease course has been improving (this patient was previously seen in consult for hyperparathyroidism, hypothyroidism, osteoporosis). There are no hypoglycemic associated symptoms. Pertinent negatives for hypoglycemia include no confusion, headaches, pallor or seizures. Associated symptoms include fatigue. Pertinent negatives for diabetes include no chest pain, no polydipsia, no polyphagia and no polyuria. There are no hypoglycemic complications. Symptoms are  improving. Risk factors for coronary artery disease include hypertension, obesity, family history, sedentary lifestyle and post-menopausal. Current diabetic treatment includes insulin injections (she is on Lantus 13 units, glimepiride 4 mg po BID, and mtf 500 mg x 2). Her weight is fluctuating minimally. She is following a generally unhealthy diet. When asked about meal planning, she reported none. She has not had a previous visit with a dietitian. She never participates in exercise. Her home blood glucose trend is decreasing steadily. Her breakfast blood glucose range is generally 110-130 mg/dl. Her lunch blood glucose range is generally 110-130 mg/dl. Her dinner blood glucose range is generally 110-130 mg/dl. Her bedtime blood glucose range is generally 110-130 mg/dl. Her overall blood glucose range is 110-130 mg/dl. (She presents with her logs and meter showing significant improvement in her glycemic profile.  Her average blood glucose is 100-125 -125 mg per DL, improving from recent A1c 15.4%.   ) An ACE inhibitor/angiotensin II receptor blocker is being taken.  Hypertension This is a chronic problem. The current episode started more than 1 year ago. The problem is controlled. Pertinent negatives include no chest pain, headaches, palpitations or shortness of breath. Risk factors for coronary artery disease include diabetes mellitus, obesity, sedentary lifestyle and post-menopausal state. Past treatments include ACE inhibitors.  Hyperlipidemia This is a chronic problem. The current episode started more than 1 year ago. Exacerbating diseases include diabetes and hypothyroidism. Pertinent negatives include no chest pain, myalgias or shortness of breath. Risk factors for coronary artery disease include dyslipidemia, diabetes mellitus, hypertension, obesity, a sedentary lifestyle and post-menopausal.     Review of Systems  Constitutional:  Positive for fatigue. Negative for chills, fever and unexpected  weight change.  HENT:  Negative for trouble swallowing and voice change.   Eyes:  Negative for visual disturbance.  Respiratory:  Negative for cough, shortness of breath and wheezing.   Cardiovascular:  Negative for chest pain, palpitations and leg swelling.  Gastrointestinal:  Negative for diarrhea, nausea and vomiting.  Endocrine: Negative for cold intolerance, heat intolerance, polydipsia, polyphagia and polyuria.  Musculoskeletal:  Negative for arthralgias and myalgias.  Skin:  Negative for color change, pallor, rash and wound.  Neurological:  Negative for seizures and headaches.  Psychiatric/Behavioral:  Negative for confusion and suicidal ideas.     Objective:       12/15/2021    9:46 AM 12/02/2021    9:43 AM 08/06/2018   10:10 AM  Vitals with BMI  Height _0  _1  _2   Weight 171 lbs  173 lbs 185 lbs  BMI 27.61 71.24 58.09  Systolic 983 382 505  Diastolic 78 80 81  Pulse 96 88 80    BP 122/78   Pulse 96   Ht _0  (1.676 m)   Wt 171 lb (77.6 kg)   BMI 27.60 kg/m   Wt Readings from Last 3 Encounters:  12/15/21 171 lb (77.6 kg)  12/02/21 173 lb (78.5 kg)  08/06/18 185 lb (83.9 kg)      CMP ( most recent) CMP     Component Value Date/Time   NA 134 (A) 09/29/2021 0000   K 4.4 09/29/2021 0000   CL 99 09/29/2021 0000   CO2 19 09/29/2021 0000   GLUCOSE 303 (H) 04/16/2018 1018   BUN 12 09/29/2021 0000   CREATININE 0.8 09/29/2021 0000   CREATININE 0.83 04/16/2018 1018   CALCIUM 9.0 09/29/2021 0000   ALBUMIN 4.0 09/29/2021 0000   AST 9 (A) 09/29/2021 0000   ALT 7 09/29/2021 0000   ALKPHOS 57 09/29/2021 0000   GFRNONAA >60 04/16/2018 1018   GFRAA >60 04/16/2018 1018     Diabetic Labs (most recent): Lab Results  Component Value Date   HGBA1C 15.4 09/29/2021   HGBA1C 10.4 (H) 04/16/2018   HGBA1C 9.4 06/08/2017     Lipid Panel ( most recent) Lipid Panel     Component Value Date/Time   CHOL 235 (A) 09/29/2021 0000   TRIG 744 (A) 09/29/2021 0000    HDL 33 (A) 09/29/2021 0000   LDLCALC 82 09/29/2021 0000      Lab Results  Component Value Date   TSH 2.41 06/08/2017           Assessment & Plan:   1. Type 2 diabetes mellitus with hyperglycemia, with long-term current use of insulin (HCC)   - Priscilla Bowers has currently uncontrolled symptomatic type 2 DM since  71 years of age. She presents with her logs and meter showing significant improvement in her glycemic profile.  Her average blood glucose is 100-125 -125 mg per DL, improving from recent A1c 15.4%.    Recent labs reviewed. - I had a long discussion with her about the progressive nature of diabetes and the pathology behind its complications. -her diabetes is complicated by comorbid conditions including HTN, HPL, sedentary life and she remains at a high risk for more acute and chronic complications which include CAD, CVA, CKD, retinopathy, and neuropathy. These are all discussed in detail with her.  - I discussed all available options of managing her diabetes including de-escalation of medications. I have counseled her on diet  and weight management  by adopting a Whole Food , Plant Predominant  ( WFPP) nutrition as recommended by [employer] of Lifestyle Medicine. Patient is encouraged to switch to  unprocessed or minimally processed  complex starch, adequate protein intake (mainly plant source), minimal liquid fat ( mainly vegetable oils), plenty of fruits, and vegetables. -  she is advised to stick to a routine mealtimes to eat 3 complete meals a day and snack only when necessary ( to snack only to correct hypoglycemia BG <70 day time or <100 at night).   - she acknowledges that there is a room for improvement in her food and drink choices. - Suggestion is made for her to avoid simple carbohydrates  from her diet including Cakes, Sweet Desserts, Ice Cream, Soda (diet and regular), Sweet Tea, Candies, Chips, Cookies, Store Bought Juices, Alcohol , Artificial Sweeteners,   Coffee Creamer, and "Sugar-free"  Products, Lemonade. This will help patient to have more stable blood glucose profile and potentially avoid unintended weight gain.  The following Lifestyle Medicine recommendations according to [place]  [facility]) were discussed and and offered to patient and she  agrees to start the journey:  A. Whole Foods, Plant-Based Nutrition comprising of fruits and vegetables, plant-based proteins, whole-grain carbohydrates was discussed in detail with the patient.   A list for source of those nutrients were also provided to the patient.  Patient will use only water or unsweetened tea for hydration. B.  The need to stay away from risky substances including alcohol, smoking; obtaining 7 to 9 hours of restorative sleep, at least 150 minutes of moderate intensity exercise weekly, the importance of healthy social connections,  and stress management techniques were discussed. C.  A full color page of  Calorie density of various food groups per pound showing examples of each food groups was provided to the patient.  - she will be scheduled with Jearld Fenton, RDN, CDE for individualized diabetes education.  - I have approached her with the following plan to manage  her diabetes and patient agrees:   - she is continue Lantus 20 units daily at bedtime , continue to monitor blood glucose twice a day-daily before breakfast and at bedtime.   -Based on her presentation with controlled glycemic profile both fasting and postprandial, she will not need prandial insulin for now.  - she is warned not to take insulin without proper monitoring per orders.  - she is encouraged to call clinic for blood glucose levels less than 70 or above 200 mg /dl. - she is advised to continue Metformin 533m po BID, therapeutically suitable for patient . -She is advised to continue glipizide 5 mg XL p.o. daily at breakfast.  - Specific targets for  A1c;  LDL, HDL,  and  Triglycerides were discussed with the patient.  2) Blood Pressure /Hypertension:   Her blood pressure is controlled to target.   she is advised to continue her current medications including Losartan 50 mg p.o. daily with breakfast . 3) Lipids/Hyperlipidemia:   Review of her recent lipid panel showed   controlled  LDL at 52.  she  is a candidate for WFPB , not on a statin, will be approached for WFPB diet next visit.   4)  Weight/Diet:  Body mass index is 27.6 kg/m.  -    she is a candidate for some  weight loss. I discussed with her the fact that loss of 5 - 10% of her  current body weight will have the most impact on her diabetes management.  The above detailed  ACLM recommendations for nutrition, exercise, sleep, social life, avoidance of risky substances, the need for restorative sleep   information will also detailed on discharge instructions.  5) Hypothyroidism: she is on levothyroxine 100 mcg po qam.   - We discussed about the correct intake of her thyroid hormone, on empty stomach at fasting, with water, separated by at least 30 minutes from breakfast and other medications,  and separated by more than 4 hours from calcium, iron, multivitamins, acid reflux medications (PPIs). -Patient is made aware of the fact that thyroid hormone replacement is needed for life, dose to be adjusted by periodic monitoring of thyroid function tests.    6) Osteoporosis: She is on alendronate 70 mg po weekly. She did not have recent bone density. She is advised to continue. She will be considered for  repeat DXA after her next  visit.  7) Chronic Care/Health Maintenance:  -she  is on ARB  medications and  is encouraged to initiate and continue to follow up with Ophthalmology, Dentist,  Podiatrist at least yearly or according to recommendations, and advised to  stay away from smoking. I have recommended yearly flu vaccine and pneumonia vaccine at least every 5 years; moderate intensity exercise for up to 150  minutes weekly; and  sleep for 7- 9 hours a day.  - she is  advised to maintain close follow up with Andres Shad, MD for primary care needs, as well as her other providers for optimal and coordinated care.   I spent 33 minutes in the care of the patient today including review of labs from [place], Lipids, Thyroid Function, Hematology (current and previous including abstractions from other facilities); face-to-face time discussing  her blood glucose readings/logs, discussing hypoglycemia and hyperglycemia episodes and symptoms, medications doses, her options of short and long term treatment based on the latest standards of care / guidelines;  discussion about incorporating lifestyle medicine;  and documenting the encounter. Risk reduction counseling performed per USPSTF guidelines to reduce obesity and cardiovascular risk factors.     Please refer to Patient Instructions for Blood Glucose Monitoring and Insulin/Medications Dosing Guide"  in media tab for additional information. Please  also refer to " Patient Self Inventory" in the Media  tab for reviewed elements of pertinent patient history.  Harvie Junior participated in the discussions, expressed understanding, and voiced agreement with the above plans.  All questions were answered to her satisfaction. she is encouraged to contact clinic should she have any questions or concerns prior to her return visit.    Follow up plan: - Return in about 5 weeks (around 01/19/2022) for Bring Meter/CGM Device/Logs- A1c in Office.  Glade Lloyd, MD [facility] [address] [place], [place] [address] Phone: [phone]  Fax: [phone]    12/15/2021, 1:41 PM  This note was partially dictated with voice recognition software. Similar sounding words can be transcribed inadequately or may not  be corrected upon review.

## 2022-01-18 ENCOUNTER — Ambulatory Visit (INDEPENDENT_AMBULATORY_CARE_PROVIDER_SITE_OTHER): Payer: Medicare (Managed Care) | Admitting: "Endocrinology

## 2022-01-18 ENCOUNTER — Encounter: Payer: Self-pay | Admitting: "Endocrinology

## 2022-01-18 VITALS — BP 130/78 | HR 92 | Ht 66.0 in | Wt 174.4 lb

## 2022-01-18 DIAGNOSIS — I10 Essential (primary) hypertension: Secondary | ICD-10-CM

## 2022-01-18 DIAGNOSIS — E039 Hypothyroidism, unspecified: Secondary | ICD-10-CM | POA: Diagnosis not present

## 2022-01-18 DIAGNOSIS — Z794 Long term (current) use of insulin: Secondary | ICD-10-CM | POA: Diagnosis not present

## 2022-01-18 DIAGNOSIS — E1165 Type 2 diabetes mellitus with hyperglycemia: Secondary | ICD-10-CM | POA: Diagnosis not present

## 2022-01-18 DIAGNOSIS — E782 Mixed hyperlipidemia: Secondary | ICD-10-CM | POA: Diagnosis not present

## 2022-01-18 LAB — POCT GLYCOSYLATED HEMOGLOBIN (HGB A1C): HbA1c, POC (controlled diabetic range): 9.3 % — AB (ref 0.0–7.0)

## 2022-01-18 NOTE — Progress Notes (Signed)
Endocrinology Consult Note       01/18/2022, 5:21 PM   Subjective:    Patient ID: Priscilla Bowers, female    DOB: 1951/05/17.  Priscilla Bowers is being seen in consultation for management of currently uncontrolled symptomatic diabetes requested by  Andres Shad, MD.   Past Medical History:  Diagnosis Date   Asthma    Diabetes mellitus, type II ([place])    Dyspnea    Hyperlipidemia    Hypertension    Hypothyroidism     Past Surgical History:  Procedure Laterality Date   EYE SURGERY     PARATHYROIDECTOMY Left 04/23/2018   Procedure: LEFT SUPERIOR AND INFERIOR PARATHYROIDECTOMY;  Surgeon: Armandina Gemma, MD;  Location: [facility] OR;  Service: General;  Laterality: Left;   TONSILLECTOMY     TUBAL LIGATION      Social History   Socioeconomic History   Marital status: Unknown    Spouse name: Not on file   Number of children: Not on file   Years of education: Not on file   Highest education level: Not on file  Occupational History   Not on file  Tobacco Use   Smoking status: Never   Smokeless tobacco: Never  Vaping Use   Vaping Use: Never used  Substance and Sexual Activity   Alcohol use: No   Drug use: No   Sexual activity: Not on file  Other Topics Concern   Not on file  Social History Narrative   Not on file   Social Determinants of Health   Financial Resource Strain: Not on file  Food Insecurity: Not on file  Transportation Needs: Not on file  Physical Activity: Not on file  Stress: Not on file  Social Connections: Not on file    Family History  Problem Relation Age of Onset   Hypertension Mother    Diabetes Mother    Heart attack Mother    Cancer Mother     Outpatient Encounter Medications as of 01/18/2022  Medication Sig   alendronate (FOSAMAX) 70 MG tablet TAKE ONE (1) TABLET BY MOUTH EVERY WEEK  WITH A FULL GLASS OF WATER ON AN EMPTY STOMACH    amLODipine (NORVASC) 5 MG  tablet Take 5 mg by mouth daily.   Blood Glucose Monitoring Suppl (ACCU-CHEK GUIDE) w/Device KIT 1 Piece by Does not apply route as directed. (Patient not taking: Reported on 04/11/2018)   glipiZIDE (GLUCOTROL XL) 5 MG 24 hr tablet Take 1 tablet (5 mg total) by mouth daily with breakfast.   glucose blood (ACCU-CHEK GUIDE) test strip Use as instructed (Patient not taking: Reported on 04/11/2018)   LANTUS SOLOSTAR 100 UNIT/ML Solostar Pen Inject 20 Units into the skin at bedtime.   levothyroxine (SYNTHROID) 100 MCG tablet TAKE ONE (1) TABLET (100 MCG TOTAL) BY MOUTH DAILY BEFORE BREAKFAST.    losartan (COZAAR) 50 MG tablet Take 50 mg by mouth daily.   meclizine (ANTIVERT) 25 MG tablet Take 25 mg by mouth 3 (three) times daily as needed for dizziness.    metFORMIN (GLUCOPHAGE) 500 MG tablet Take 500 mg by mouth 2 (two) times daily with a meal.  metoprolol tartrate (LOPRESSOR) 25 MG tablet Take 25 mg by mouth 2 (two) times daily.   omeprazole (PRILOSEC) 40 MG capsule Take 40 mg by mouth daily.   Vitamin D, Ergocalciferol, (DRISDOL) 1.25 MG (50000 UT) CAPS capsule TAKE 1 CAPSULE BY MOUTH EVERY WEEK   No facility-administered encounter medications on file as of 01/18/2022.    ALLERGIES: No Known Allergies  VACCINATION STATUS:  There is no immunization history on file for this patient.  Diabetes She presents for her follow-up diabetic visit. She has type 2 diabetes mellitus. Onset time: she was diagnosed at approx age of 46 yrs. Her disease course has been improving (this patient was previously seen in consult for hyperparathyroidism, hypothyroidism, osteoporosis). There are no hypoglycemic associated symptoms. Pertinent negatives for hypoglycemia include no confusion, headaches, pallor or seizures. Associated symptoms include fatigue. Pertinent negatives for diabetes include no chest pain, no polydipsia, no polyphagia and no polyuria. There are no hypoglycemic complications. Symptoms are improving.  Risk factors for coronary artery disease include hypertension, obesity, family history, sedentary lifestyle and post-menopausal. Current diabetic treatment includes insulin injections (she is on Lantus 13 units, glimepiride 4 mg po BID, and mtf 500 mg x 2). Her weight is fluctuating minimally. She is following a generally unhealthy diet. When asked about meal planning, she reported none. She has not had a previous visit with a dietitian. She never participates in exercise. Her home blood glucose trend is decreasing steadily. Her breakfast blood glucose range is generally 110-130 mg/dl. Her lunch blood glucose range is generally 110-130 mg/dl. Her dinner blood glucose range is generally 110-130 mg/dl. Her bedtime blood glucose range is generally 110-130 mg/dl. Her overall blood glucose range is 110-130 mg/dl. (She presents with her logs and meter showing significant improvement in her glycemic profile.  Her average blood glucose is between 100-125 mg per DL, point-of-care A1c 9.3% improving from  recent A1c 15.4%.  She did not document any hypoglycemia. ) An ACE inhibitor/angiotensin II receptor blocker is being taken.  Hypertension This is a chronic problem. The current episode started more than 1 year ago. The problem is controlled. Pertinent negatives include no chest pain, headaches, palpitations or shortness of breath. Risk factors for coronary artery disease include diabetes mellitus, obesity, sedentary lifestyle and post-menopausal state. Past treatments include ACE inhibitors.  Hyperlipidemia This is a chronic problem. The current episode started more than 1 year ago. Exacerbating diseases include diabetes and hypothyroidism. Pertinent negatives include no chest pain, myalgias or shortness of breath. Risk factors for coronary artery disease include dyslipidemia, diabetes mellitus, hypertension, obesity, a sedentary lifestyle and post-menopausal.     Review of Systems  Constitutional:  Positive for  fatigue. Negative for chills, fever and unexpected weight change.  HENT:  Negative for trouble swallowing and voice change.   Eyes:  Negative for visual disturbance.  Respiratory:  Negative for cough, shortness of breath and wheezing.   Cardiovascular:  Negative for chest pain, palpitations and leg swelling.  Gastrointestinal:  Negative for diarrhea, nausea and vomiting.  Endocrine: Negative for cold intolerance, heat intolerance, polydipsia, polyphagia and polyuria.  Musculoskeletal:  Negative for arthralgias and myalgias.  Skin:  Negative for color change, pallor, rash and wound.  Neurological:  Negative for seizures and headaches.  Psychiatric/Behavioral:  Negative for confusion and suicidal ideas.     Objective:       01/18/2022    9:38 AM 12/15/2021    9:46 AM 12/02/2021    9:43 AM  Vitals with BMI  Height  5' 6"  5' 6"  5' 6"   Weight 174 lbs 6 oz 171 lbs 173 lbs  BMI 28.16 67.20 94.70  Systolic 962 836 629  Diastolic 78 78 80  Pulse 92 96 88    BP 130/78   Pulse 92   Ht 5' 6"  (1.676 m)   Wt 174 lb 6.4 oz (79.1 kg)   BMI 28.15 kg/m   Wt Readings from Last 3 Encounters:  01/18/22 174 lb 6.4 oz (79.1 kg)  12/15/21 171 lb (77.6 kg)  12/02/21 173 lb (78.5 kg)      CMP ( most recent) CMP     Component Value Date/Time   NA 134 (A) 09/29/2021 0000   K 4.4 09/29/2021 0000   CL 99 09/29/2021 0000   CO2 19 09/29/2021 0000   GLUCOSE 303 (H) 04/16/2018 1018   BUN 12 09/29/2021 0000   CREATININE 0.8 09/29/2021 0000   CREATININE 0.83 04/16/2018 1018   CALCIUM 9.0 09/29/2021 0000   ALBUMIN 4.0 09/29/2021 0000   AST 9 (A) 09/29/2021 0000   ALT 7 09/29/2021 0000   ALKPHOS 57 09/29/2021 0000   GFRNONAA >60 04/16/2018 1018   GFRAA >60 04/16/2018 1018     Diabetic Labs (most recent): Lab Results  Component Value Date   HGBA1C 9.3 (A) 01/18/2022   HGBA1C 15.4 09/29/2021   HGBA1C 10.4 (H) 04/16/2018     Lipid Panel ( most recent) Lipid Panel     Component Value  Date/Time   CHOL 235 (A) 09/29/2021 0000   TRIG 744 (A) 09/29/2021 0000   HDL 33 (A) 09/29/2021 0000   LDLCALC 82 09/29/2021 0000      Lab Results  Component Value Date   TSH 2.41 06/08/2017      Assessment & Plan:   1. Type 2 diabetes mellitus with hyperglycemia, with long-term current use of insulin (HCC)   - Tomiko Schoon has currently uncontrolled symptomatic type 2 DM since  71 years of age.  She presents with her logs and meter showing significant improvement in her glycemic profile.  Her average blood glucose is between 100-125 mg per DL, point-of-care A1c 9.3% improving from  recent A1c 15.4%.  She did not document any hypoglycemia.  Recent labs reviewed. - I had a long discussion with her about the progressive nature of diabetes and the pathology behind its complications. -her diabetes is complicated by comorbid conditions including HTN, HPL, sedentary life and she remains at a high risk for more acute and chronic complications which include CAD, CVA, CKD, retinopathy, and neuropathy. These are all discussed in detail with her.  - I discussed all available options of managing her diabetes including de-escalation of medications. I have counseled her on diet  and weight management  by adopting a Whole Food , Plant Predominant  ( WFPP) nutrition as recommended by [employer] of Lifestyle Medicine. Patient is encouraged to switch to  unprocessed or minimally processed  complex starch, adequate protein intake (mainly plant source), minimal liquid fat ( mainly vegetable oils), plenty of fruits, and vegetables. -  she is advised to stick to a routine mealtimes to eat 3 complete meals a day and snack only when necessary ( to snack only to correct hypoglycemia BG <70 day time or <100 at night).   - she acknowledges that there is a room for improvement in her food and drink choices. - Suggestion is made for her to avoid simple carbohydrates  from her diet including Cakes, Sweet  Desserts, Ice  Cream, Soda (diet and regular), Sweet Tea, Candies, Chips, Cookies, Store Bought Juices, Alcohol , Artificial Sweeteners,  Coffee Creamer, and "Sugar-free" Products, Lemonade. This will help patient to have more stable blood glucose profile and potentially avoid unintended weight gain.  The following Lifestyle Medicine recommendations according to [place]  [facility]) were discussed and and offered to patient and she  agrees to start the journey:  A. Whole Foods, Plant-Based Nutrition comprising of fruits and vegetables, plant-based proteins, whole-grain carbohydrates was discussed in detail with the patient.   A list for source of those nutrients were also provided to the patient.  Patient will use only water or unsweetened tea for hydration. B.  The need to stay away from risky substances including alcohol, smoking; obtaining 7 to 9 hours of restorative sleep, at least 150 minutes of moderate intensity exercise weekly, the importance of healthy social connections,  and stress management techniques were discussed. C.  A full color page of  Calorie density of various food groups per pound showing examples of each food groups was provided to the patient.   - she will be scheduled with Jearld Fenton, RDN, CDE for individualized diabetes education.  - I have approached her with the following plan to manage  her diabetes and patient agrees:   -In light of her presentation with near target glycemic profile, she will not need prandial insulin for now.  She is advised to continue Lantus 20 units nightly,   continue to monitor blood glucose twice a day-daily before breakfast and at bedtime.  - she is warned not to take insulin without proper monitoring per orders.  - she is encouraged to call clinic for blood glucose levels less than 70 or above 200 mg /dl. - she is advised to continue Metformin 518m po BID, therapeutically suitable for patient . -She is advised  to continue glipizide 5 mg XL p.o. daily at breakfast.  - Specific targets for  A1c;  LDL, HDL,  and Triglycerides were discussed with the patient.  2) Blood Pressure /Hypertension:   -Her blood pressure is controlled to target.  she is advised to continue her current medications including Losartan 50 mg p.o. daily with breakfast .  3) Lipids/Hyperlipidemia:   Review of her recent lipid panel showed   controlled  LDL at 52.  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  She will be considered for fasting lipid panel at subsequent visits.   4)  Weight/Diet:  Body mass index is 28.15 kg/m.  -    she is a candidate for some  weight loss. I discussed with her the fact that loss of 5 - 10% of her  current body weight will have the most impact on her diabetes management.  The above detailed  ACLM recommendations for nutrition, exercise, sleep, social life, avoidance of risky substances, the need for restorative sleep   information will also detailed on discharge instructions.  5) Hypothyroidism: she is on levothyroxine 100 mcg po qam. She does not have recent thyroid function test.  - We discussed about the correct intake of her thyroid hormone, on empty stomach at fasting, with water, separated by at least 30 minutes from breakfast and other medications,  and separated by more than 4 hours from calcium, iron, multivitamins, acid reflux medications (PPIs). -Patient is made aware of the fact that thyroid hormone replacement is needed for life, dose to be adjusted by periodic monitoring of  thyroid function tests.   6) Osteoporosis: She is on alendronate 70 mg po weekly. She did not have recent bone density. She is advised to continue. She will be considered for repeat DXA after her next  visit.  7) Chronic Care/Health Maintenance:  -she  is on ARB  medications and  is encouraged to initiate and continue to follow up with Ophthalmology, Dentist,  Podiatrist at least  yearly or according to recommendations, and advised to  stay away from smoking. I have recommended yearly flu vaccine and pneumonia vaccine at least every 5 years; moderate intensity exercise for up to 150 minutes weekly; and  sleep for 7- 9 hours a day.  - she is  advised to maintain close follow up with Andres Shad, MD for primary care needs, as well as her other providers for optimal and coordinated care.   I spent 41 minutes in the care of the patient today including review of labs from [place], Lipids, Thyroid Function, Hematology (current and previous including abstractions from other facilities); face-to-face time discussing  her blood glucose readings/logs, discussing hypoglycemia and hyperglycemia episodes and symptoms, medications doses, her options of short and long term treatment based on the latest standards of care / guidelines;  discussion about incorporating lifestyle medicine;  and documenting the encounter. Risk reduction counseling performed per USPSTF guidelines to reduce obesity and cardiovascular risk factors.     Please refer to Patient Instructions for Blood Glucose Monitoring and Insulin/Medications Dosing Guide"  in media tab for additional information. Please  also refer to " Patient Self Inventory" in the Media  tab for reviewed elements of pertinent patient history.  Harvie Junior participated in the discussions, expressed understanding, and voiced agreement with the above plans.  All questions were answered to her satisfaction. she is encouraged to contact clinic should she have any questions or concerns prior to her return visit.   Follow up plan: - Return in about 3 months (around 04/20/2022) for F/U with Pre-visit Labs, Meter/CGM/Logs, A1c here.  Glade Lloyd, MD [facility] [address] [place], [place] [address] Phone: [phone]  Fax: [phone]    01/18/2022, 5:21 PM  This note was partially  dictated with voice recognition software. Similar sounding words can be transcribed inadequately or may not  be corrected upon review.

## 2022-01-18 NOTE — Patient Instructions (Signed)
                                     Advice for Weight Management  -For most of us the best way to lose weight is by diet management. Generally speaking, diet management means consuming less calories intentionally which over time brings about progressive weight loss.  This can be achieved more effectively by avoiding ultra processed carbohydrates, processed meats, unhealthy fats.    It is critically important to know your numbers: how much calorie you are consuming and how much calorie you need. More importantly, our carbohydrates sources should be unprocessed naturally occurring  complex starch food items.  It is always important to balance nutrition also by  appropriate intake of proteins (mainly plant-based), healthy fats/oils, plenty of fruits and vegetables.   -The American College of Lifestyle Medicine (ACL M) recommends nutrition derived mostly from Whole Food, Plant Predominant Sources example an apple instead of applesauce or apple pie. Eat Plenty of vegetables, Mushrooms, fruits, Legumes, Whole Grains, Nuts, seeds in lieu of processed meats, processed snacks/pastries red meat, poultry, eggs.  Use only water or unsweetened tea for hydration.  The College also recommends the need to stay away from risky substances including alcohol, smoking; obtaining 7-9 hours of restorative sleep, at least 150 minutes of moderate intensity exercise weekly, importance of healthy social connections, and being mindful of stress and seek help when it is overwhelming.    -Sticking to a routine mealtime to eat 3 meals a day and avoiding unnecessary snacks is shown to have a big role in weight control. Under normal circumstances, the only time we burn stored energy is when we are hungry, so allow  some hunger to take place- hunger means no food between appropriate meal times, only water.  It is not advisable to starve.   -It is better to avoid simple carbohydrates including:  Cakes, Sweet Desserts, Ice Cream, Soda (diet and regular), Sweet Tea, Candies, Chips, Cookies, Store Bought Juices, Alcohol in Excess of  1-2 drinks a day, Lemonade,  Artificial Sweeteners, Doughnuts, Coffee Creamers, "Sugar-free" Products, etc, etc.  This is not a complete list.....    -Consulting with certified diabetes educators is proven to provide you with the most accurate and current information on diet.  Also, you may be  interested in discussing diet options/exchanges , we can schedule a visit with Priscilla Bowers, RDN, CDE for individualized nutrition education.  -Exercise: If you are able: 30 -60 minutes a day ,4 days a week, or 150 minutes of moderate intensity exercise weekly.    The longer the better if tolerated.  Combine stretch, strength, and aerobic activities.  If you were told in the past that you have high risk for cardiovascular diseases, or if you are currently symptomatic, you may seek evaluation by your heart doctor prior to initiating moderate to intense exercise programs.                                  Additional Care Considerations for Diabetes/Prediabetes   -Diabetes  is a chronic disease.  The most important care consideration is regular follow-up with your diabetes care provider with the goal being avoiding or delaying its complications and to take advantage of advances in medications and technology.  If appropriate actions are taken early enough, type 2 diabetes can even be   reversed.  Seek information from the right source.  - Whole Food, Plant Predominant Nutrition is highly recommended: Eat Plenty of vegetables, Mushrooms, fruits, Legumes, Whole Grains, Nuts, seeds in lieu of processed meats, processed snacks/pastries red meat, poultry, eggs as recommended by American College of  Lifestyle Medicine (ACLM).  -Type 2 diabetes is known to coexist with other important comorbidities such as high blood pressure and high cholesterol.  It is critical to control not only the  diabetes but also the high blood pressure and high cholesterol to minimize and delay the risk of complications including coronary artery disease, stroke, amputations, blindness, etc.  The good news is that this diet recommendation for type 2 diabetes is also very helpful for managing high cholesterol and high blood blood pressure.  - Studies showed that people with diabetes will benefit from a class of medications known as ACE inhibitors and statins.  Unless there are specific reasons not to be on these medications, the standard of care is to consider getting one from these groups of medications at an optimal doses.  These medications are generally considered safe and proven to help protect the heart and the kidneys.    - People with diabetes are encouraged to initiate and maintain regular follow-up with eye doctors, foot doctors, dentists , and if necessary heart and kidney doctors.     - It is highly recommended that people with diabetes quit smoking or stay away from smoking, and get yearly  flu vaccine and pneumonia vaccine at least every 5 years.  See above for additional recommendations on exercise, sleep, stress management , and healthy social connections.      

## 2022-01-26 ENCOUNTER — Encounter: Payer: Self-pay | Admitting: Nutrition

## 2022-01-26 ENCOUNTER — Encounter: Payer: Medicare (Managed Care) | Attending: Family Medicine | Admitting: Nutrition

## 2022-01-26 VITALS — Ht 66.0 in | Wt 174.2 lb

## 2022-01-26 DIAGNOSIS — I10 Essential (primary) hypertension: Secondary | ICD-10-CM | POA: Diagnosis present

## 2022-01-26 DIAGNOSIS — E1165 Type 2 diabetes mellitus with hyperglycemia: Secondary | ICD-10-CM | POA: Diagnosis not present

## 2022-01-26 DIAGNOSIS — Z794 Long term (current) use of insulin: Secondary | ICD-10-CM | POA: Diagnosis present

## 2022-01-26 DIAGNOSIS — E782 Mixed hyperlipidemia: Secondary | ICD-10-CM | POA: Insufficient documentation

## 2022-01-26 NOTE — Patient Instructions (Addendum)
Goals  Glipizide, Lantus 20 units, Metformin 500 mg BID.- Increaser fresh fruits and vegetables and whole grains. Choose whole wheat bread, raisin bran,  Try to get Almond milk Increase exercise of walking 30 minutes 2-3 times per week. Drink only water-4-5 bottles per day Get A1C down to 7%

## 2022-01-26 NOTE — Progress Notes (Signed)
Medical Nutrition Therapy  Appointment Start time:  [id number]  Appointment End time:  1610  Primary concerns today: Dm Type 2  Referral diagnosis: E11.8 Preferred learning style:  No preference . Learning readiness: Change in progress   NUTRITION ASSESSMENT  71 year old female here for Type 2 DM. Testing twice a day. FBS 89-120's Bedtime 180-200's Sees Dr. Dorris Fetch, Endocrinology. Following Lifestyle Medicine. 20 units of Lantus at bedtime. Metformin 500 mg BID Glipizide 5 mg a day.  Changes made recently: eating more whole grains, 1% milk, and eating more fruits and vegetables. Has cut out chips and junk food.  Anthropometrics  Wt Readings from Last 3 Encounters:  01/26/22 174 lb 3.2 oz (79 kg)  01/18/22 174 lb 6.4 oz (79.1 kg)  12/15/21 171 lb (77.6 kg)   Ht Readings from Last 3 Encounters:  01/26/22 '5\' 6"'$  (1.676 m)  01/18/22 '5\' 6"'$  (1.676 m)  12/15/21 '5\' 6"'$  (1.676 m)   Body mass index is 28.12 kg/m. '@BMIFA'$ @ Facility age limit for growth %iles is 20 years. Facility age limit for growth %iles is 20 years.    Clinical Medical Hx: See chart Medications: Glipizide, Lantus 20 units, Metformin 500 mg BID.- see chart Labs:  A1C 15.4% and now down to 9.3%. She had made some progress.  Current diet is insuffient to meet her needs. It is too restrictive in CHO and protein sources with meals.   Lab Results  Component Value Date   HGBA1C 9.3 (A) 01/18/2022      Latest Ref Rng & Units 09/29/2021   12:00 AM 04/16/2018   10:18 AM 06/08/2017   12:00 AM  CMP  Glucose 70 - 99 mg/dL  303    BUN 4 - '21 12     12  12      '$ Creatinine 0.5 - 1.1 0.8     0.83  0.8      Sodium 137 - 147 134     133    Potassium 3.5 - 5.1 mEq/L 4.4     4.3    Chloride 99 - 108 99     106    CO2 13 - '22 19     23    '$ Calcium 8.7 - 10.7 9.0     11.1    Alkaline Phos 25 - 125 57        AST 13 - 35 9        ALT 7 - 35 U/L 7           This result is from an external source.   Lipid Panel     Component  Value Date/Time   CHOL 235 (A) 09/29/2021 0000   TRIG 744 (A) 09/29/2021 0000   HDL 33 (A) 09/29/2021 0000   LDLCALC 82 09/29/2021 0000    Notable Signs/Symptoms: increased thirst, fatigue  Lifestyle & Dietary Hx LIves with her husband.  Estimated daily fluid intake: 24 oz Supplements: VIt d Sleep: good Stress / self-care: varies Current average weekly physical activity: ADL  24-Hr Dietary Recall First Meal: Sausage, eggs and toast Snack:  Second Meal: Shrimp, water Snack: Fruit Third Meal: Pork chop-fried,  water Snack:  Beverages: water  Estimated Energy Needs Calories: 1400 Carbohydrate: 158g Protein: 105g Fat: 39g   NUTRITION DIAGNOSIS  NB-1.1 Food and nutrition-related knowledge deficit As related to DIabetes Type 2  As evidenced by A1C 15.4%.   NUTRITION INTERVENTION  Nutrition education (E-1) on the following topics:  Nutrition and Diabetes education  provided on My Plate, CHO counting, meal planning, portion sizes, timing of meals, avoiding snacks between meals unless having a low blood sugar, target ranges for A1C and blood sugars, signs/symptoms and treatment of hyper/hypoglycemia, monitoring blood sugars, taking medications as prescribed, benefits of exercising 30 minutes per day and prevention of complications of DM.  Lifestyle Medicine  - Whole Food, Plant Predominant Nutrition is highly recommended: Eat Plenty of vegetables, Mushrooms, fruits, Legumes, Whole Grains, Nuts, seeds in lieu of processed meats, processed snacks/pastries red meat, poultry, eggs.    -It is better to avoid simple carbohydrates including: Cakes, Sweet Desserts, Ice Cream, Soda (diet and regular), Sweet Tea, Candies, Chips, Cookies, Store Bought Juices, Alcohol in Excess of  1-2 drinks a day, Lemonade,  Artificial Sweeteners, Doughnuts, Coffee Creamers, "Sugar-free" Products, etc, etc.  This is not a complete list.....  Exercise: If you are able: 30 -60 minutes a day ,4 days a week,  or 150 minutes a week.  The longer the better.  Combine stretch, strength, and aerobic activities.  If you were told in the past that you have high risk for cardiovascular diseases, you may seek evaluation by your heart doctor prior to initiating moderate to intense exercise programs.   Handouts Provided Include  LIfestyle handouts Meal Plan Card  Learning Style & Readiness for Change Teaching method utilized: Visual & Auditory  Demonstrated degree of understanding via: Teach Back  Barriers to learning/adherence to lifestyle change: none  Goals Established by Pt Goals  Glipizide, Lantus 20 units, Metformin 500 mg BID.- Increaser fresh fruits and vegetables and whole grains. Choose whole wheat bread, raisin bran,  Try to get Almond milk Increase exercise of walking 30 minutes 2-3 times per week. Drink only water-4-5 bottles per day Get A1C down to 7%    MONITORING & EVALUATION Dietary intake, weekly physical activity, and blood sugars  in 1 month.  Next Steps  Patient is to work on meal planning, timing of meals and eating more whole food plant based meals.

## 2022-03-29 ENCOUNTER — Ambulatory Visit: Payer: Medicare HMO | Admitting: Nutrition

## 2022-04-20 ENCOUNTER — Ambulatory Visit: Payer: Medicare HMO | Admitting: "Endocrinology

## 2022-04-26 ENCOUNTER — Ambulatory Visit: Payer: Medicare HMO | Admitting: "Endocrinology

## 2022-04-29 LAB — LIPID PANEL
Cholesterol: 172 (ref 0–200)
HDL: 41 (ref 35–70)
LDL Cholesterol: 79
Triglycerides: 258 — AB (ref 40–160)

## 2022-04-29 LAB — BASIC METABOLIC PANEL
BUN: 11 (ref 4–21)
CO2: 26 — AB (ref 13–22)
Chloride: 110 — AB (ref 99–108)
Creatinine: 0.8 (ref 0.5–1.1)
Glucose: 155
Potassium: 4.4 mEq/L (ref 3.5–5.1)
Sodium: 142 (ref 137–147)

## 2022-04-29 LAB — COMPREHENSIVE METABOLIC PANEL
Albumin: 3.3 — AB (ref 3.5–5.0)
Calcium: 8.6 — AB (ref 8.7–10.7)

## 2022-04-29 LAB — HEPATIC FUNCTION PANEL
ALT: 10 U/L (ref 7–35)
AST: 5 — AB (ref 13–35)
Alkaline Phosphatase: 39 (ref 25–125)
Bilirubin, Total: 0.4

## 2022-04-29 LAB — VITAMIN B12: Vitamin B-12: 356

## 2022-04-29 LAB — TSH: TSH: 5.65 (ref 0.41–5.90)

## 2022-04-29 LAB — VITAMIN D 25 HYDROXY (VIT D DEFICIENCY, FRACTURES): Vit D, 25-Hydroxy: 24

## 2022-05-05 ENCOUNTER — Ambulatory Visit: Payer: Medicare HMO | Admitting: "Endocrinology

## 2022-05-09 ENCOUNTER — Other Ambulatory Visit: Payer: Self-pay | Admitting: "Endocrinology

## 2022-06-02 ENCOUNTER — Ambulatory Visit: Payer: Medicare HMO | Admitting: "Endocrinology

## 2022-06-27 ENCOUNTER — Ambulatory Visit (INDEPENDENT_AMBULATORY_CARE_PROVIDER_SITE_OTHER): Payer: Medicare (Managed Care) | Admitting: "Endocrinology

## 2022-06-27 ENCOUNTER — Encounter: Payer: Self-pay | Admitting: "Endocrinology

## 2022-06-27 VITALS — BP 136/82 | HR 92 | Ht 66.0 in | Wt 169.0 lb

## 2022-06-27 DIAGNOSIS — Z794 Long term (current) use of insulin: Secondary | ICD-10-CM | POA: Diagnosis not present

## 2022-06-27 DIAGNOSIS — E1165 Type 2 diabetes mellitus with hyperglycemia: Secondary | ICD-10-CM | POA: Diagnosis not present

## 2022-06-27 DIAGNOSIS — E782 Mixed hyperlipidemia: Secondary | ICD-10-CM

## 2022-06-27 DIAGNOSIS — I10 Essential (primary) hypertension: Secondary | ICD-10-CM

## 2022-06-27 DIAGNOSIS — E039 Hypothyroidism, unspecified: Secondary | ICD-10-CM | POA: Diagnosis not present

## 2022-06-27 LAB — POCT GLYCOSYLATED HEMOGLOBIN (HGB A1C): HbA1c, POC (controlled diabetic range): 7 % (ref 0.0–7.0)

## 2022-06-27 MED ORDER — LEVOTHYROXINE SODIUM 112 MCG PO TABS: 112.0000 ug | ORAL_TABLET | Freq: Every day | ORAL | 1 refills | Status: DC

## 2022-06-27 NOTE — Patient Instructions (Signed)

## 2022-06-27 NOTE — Progress Notes (Signed)
06/27/2022, 11:35 PM  Endocrinology follow-up note   Subjective:    Patient ID: Priscilla Bowers, female    DOB: 1951-05-30.  Priscilla Bowers is being seen in follow-up after she was seen in consultation for management of currently uncontrolled symptomatic diabetes requested by  Andres Shad, MD.   Past Medical History:  Diagnosis Date   Asthma    Diabetes mellitus, type II ([place])    Dyspnea    Hyperlipidemia    Hypertension    Hypothyroidism     Past Surgical History:  Procedure Laterality Date   EYE SURGERY     PARATHYROIDECTOMY Left 04/23/2018   Procedure: LEFT SUPERIOR AND INFERIOR PARATHYROIDECTOMY;  Surgeon: Armandina Gemma, MD;  Location: [facility] OR;  Service: General;  Laterality: Left;   TONSILLECTOMY     TUBAL LIGATION      Social History   Socioeconomic History   Marital status: Unknown    Spouse name: Not on file   Number of children: Not on file   Years of education: Not on file   Highest education level: Not on file  Occupational History   Not on file  Tobacco Use   Smoking status: Never   Smokeless tobacco: Never  Vaping Use   Vaping Use: Never used  Substance and Sexual Activity   Alcohol use: No   Drug use: No   Sexual activity: Not on file  Other Topics Concern   Not on file  Social History Narrative   Not on file   Social Determinants of Health   Financial Resource Strain: Not on file  Food Insecurity: Not on file  Transportation Needs: Not on file  Physical Activity: Not on file  Stress: Not on file  Social Connections: Not on file    Family History  Problem Relation Age of Onset   Hypertension Mother    Diabetes Mother    Heart attack Mother    Cancer Mother     Outpatient Encounter Medications as of 06/27/2022  Medication Sig   escitalopram (LEXAPRO) 10 MG tablet Take 10 mg by mouth daily.   alendronate (FOSAMAX) 70 MG tablet TAKE ONE (1) TABLET BY  MOUTH EVERY WEEK  WITH A FULL GLASS OF WATER ON AN EMPTY STOMACH    amLODipine (NORVASC) 5 MG tablet Take 5 mg by mouth daily.   Blood Glucose Monitoring Suppl (ACCU-CHEK GUIDE) w/Device KIT 1 Piece by Does not apply route as directed. (Patient not taking: Reported on 04/11/2018)   glipiZIDE (GLUCOTROL XL) 5 MG 24 hr tablet TAKE ONE (1) TABLET (5 MG TOTAL) BY MOUTH DAILY WITH BREAKFAST.   glucose blood (ACCU-CHEK GUIDE) test strip Use as instructed (Patient not taking: Reported on 04/11/2018)   LANTUS SOLOSTAR 100 UNIT/ML Solostar Pen Inject 10 Units into the skin at bedtime.   levothyroxine (SYNTHROID) 112 MCG tablet Take 1 tablet (112 mcg total) by mouth daily before breakfast.   losartan (COZAAR) 50 MG tablet Take 50 mg by mouth daily.   meclizine (ANTIVERT) 25 MG tablet Take 25 mg by mouth 3 (three) times daily as needed for dizziness.    metFORMIN (GLUCOPHAGE) 500 MG tablet Take 500 mg  by mouth daily with breakfast.   metoprolol tartrate (LOPRESSOR) 25 MG tablet Take 25 mg by mouth 2 (two) times daily.   omeprazole (PRILOSEC) 40 MG capsule Take 40 mg by mouth daily.   Vitamin D, Ergocalciferol, (DRISDOL) 1.25 MG (50000 UT) CAPS capsule TAKE 1 CAPSULE BY MOUTH EVERY WEEK   [DISCONTINUED] levothyroxine (SYNTHROID) 100 MCG tablet TAKE ONE (1) TABLET (100 MCG TOTAL) BY MOUTH DAILY BEFORE BREAKFAST.    No facility-administered encounter medications on file as of 06/27/2022.    ALLERGIES: No Known Allergies  VACCINATION STATUS:  There is no immunization history on file for this patient.  Diabetes She presents for her follow-up diabetic visit. She has type 2 diabetes mellitus. Onset time: she was diagnosed at approx age of 72 yrs. Her disease course has been improving (this patient was previously seen in consult for hyperparathyroidism, hypothyroidism, osteoporosis). There are no hypoglycemic associated symptoms. Pertinent negatives for hypoglycemia include no confusion, headaches, pallor or  seizures. Associated symptoms include fatigue. Pertinent negatives for diabetes include no chest pain, no polydipsia, no polyphagia and no polyuria. There are no hypoglycemic complications. Symptoms are improving. Risk factors for coronary artery disease include hypertension, obesity, family history, sedentary lifestyle and post-menopausal. Current diabetic treatment includes insulin injections (she is on Lantus 13 units, glimepiride 4 mg po BID, and mtf 500 mg x 2). Her weight is fluctuating minimally. She is following a generally unhealthy diet. When asked about meal planning, she reported none. She has not had a previous visit with a dietitian. She never participates in exercise. Her home blood glucose trend is decreasing steadily. Her breakfast blood glucose range is generally 110-130 mg/dl. Her lunch blood glucose range is generally 130-140 mg/dl. Her dinner blood glucose range is generally 130-140 mg/dl. Her bedtime blood glucose range is generally 140-180 mg/dl. Her overall blood glucose range is 140-180 mg/dl. (She presents with her logs showing controlled glycemic profile.  Her point-of-care A1c is 7%, improving from 9.3%.  Overall, her A1c is improving from 15.4%.   She did not document any hypoglycemia. ) An ACE inhibitor/angiotensin II receptor blocker is being taken.  Hypertension This is a chronic problem. The current episode started more than 1 year ago. The problem is controlled. Pertinent negatives include no chest pain, headaches, palpitations or shortness of breath. Risk factors for coronary artery disease include diabetes mellitus, obesity, sedentary lifestyle and post-menopausal state. Past treatments include ACE inhibitors.  Hyperlipidemia This is a chronic problem. The current episode started more than 1 year ago. Exacerbating diseases include diabetes and hypothyroidism. Pertinent negatives include no chest pain, myalgias or shortness of breath. Risk factors for coronary artery disease  include dyslipidemia, diabetes mellitus, hypertension, obesity, a sedentary lifestyle and post-menopausal.     Review of Systems  Constitutional:  Positive for fatigue. Negative for chills, fever and unexpected weight change.  HENT:  Negative for trouble swallowing and voice change.   Eyes:  Negative for visual disturbance.  Respiratory:  Negative for cough, shortness of breath and wheezing.   Cardiovascular:  Negative for chest pain, palpitations and leg swelling.  Gastrointestinal:  Negative for diarrhea, nausea and vomiting.  Endocrine: Negative for cold intolerance, heat intolerance, polydipsia, polyphagia and polyuria.  Musculoskeletal:  Negative for arthralgias and myalgias.  Skin:  Negative for color change, pallor, rash and wound.  Neurological:  Negative for seizures and headaches.  Psychiatric/Behavioral:  Negative for confusion and suicidal ideas.     Objective:       06/27/2022  10:27 AM 01/26/2022    3:09 PM 01/18/2022    9:38 AM  Vitals with BMI  Height '5\' 6"'$  '5\' 6"'$  '5\' 6"'$   Weight 169 lbs 174 lbs 3 oz 174 lbs 6 oz  BMI 27.29 16.10 96.04  Systolic 540  981  Diastolic 82  78  Pulse 92  92    BP 136/82   Pulse 92   Ht '5\' 6"'$  (1.676 m)   Wt 169 lb (76.7 kg)   BMI 27.28 kg/m   Wt Readings from Last 3 Encounters:  06/27/22 169 lb (76.7 kg)  01/26/22 174 lb 3.2 oz (79 kg)  01/18/22 174 lb 6.4 oz (79.1 kg)      CMP ( most recent) CMP     Component Value Date/Time   NA 142 04/29/2022 0000   K 4.4 04/29/2022 0000   CL 110 (A) 04/29/2022 0000   CO2 26 (A) 04/29/2022 0000   GLUCOSE 303 (H) 04/16/2018 1018   BUN 11 04/29/2022 0000   CREATININE 0.8 04/29/2022 0000   CREATININE 0.83 04/16/2018 1018   CALCIUM 8.6 (A) 04/29/2022 0000   ALBUMIN 3.3 (A) 04/29/2022 0000   AST 5 (A) 04/29/2022 0000   ALT 10 04/29/2022 0000   ALKPHOS 39 04/29/2022 0000   GFRNONAA >60 04/16/2018 1018   GFRAA >60 04/16/2018 1018     Diabetic Labs (most recent): Lab Results   Component Value Date   HGBA1C 7.0 06/27/2022   HGBA1C 9.3 (A) 01/18/2022   HGBA1C 15.4 09/29/2021     Lipid Panel ( most recent) Lipid Panel     Component Value Date/Time   CHOL 172 04/29/2022 0000   TRIG 258 (A) 04/29/2022 0000   HDL 41 04/29/2022 0000   LDLCALC 79 04/29/2022 0000      Lab Results  Component Value Date   TSH 5.65 04/29/2022   TSH 2.41 06/08/2017      Assessment & Plan:   1. Type 2 diabetes mellitus with hyperglycemia, with long-term current use of insulin (HCC)   - Priscilla Bowers has currently uncontrolled symptomatic type 2 DM since  72 years of age.  She presents with her logs showing controlled glycemic profile.  Her point-of-care A1c is 7%, improving from 9.3%.  Overall, her A1c is improving from 15.4%.   She did not document any hypoglycemia.  Recent labs reviewed. - I had a long discussion with her about the progressive nature of diabetes and the pathology behind its complications. -her diabetes is complicated by comorbid conditions including HTN, HPL, sedentary life and she remains at a high risk for more acute and chronic complications which include CAD, CVA, CKD, retinopathy, and neuropathy. These are all discussed in detail with her.  - I discussed all available options of managing her diabetes including de-escalation of medications. I have counseled her on diet  and weight management  by adopting a Whole Food , Plant Predominant  ( WFPP) nutrition as recommended by [employer] of Lifestyle Medicine. Patient is encouraged to switch to  unprocessed or minimally processed  complex starch, adequate protein intake (mainly plant source), minimal liquid fat ( mainly vegetable oils), plenty of fruits, and vegetables. -  she is advised to stick to a routine mealtimes to eat 3 complete meals a day and snack only when necessary ( to snack only to correct hypoglycemia BG <70 day time or <100 at night).   - she acknowledges that there is a room for  improvement in her food and drink  choices. - Suggestion is made for her to avoid simple carbohydrates  from her diet including Cakes, Sweet Desserts, Ice Cream, Soda (diet and regular), Sweet Tea, Candies, Chips, Cookies, Store Bought Juices, Alcohol , Artificial Sweeteners,  Coffee Creamer, and "Sugar-free" Products, Lemonade. This will help patient to have more stable blood glucose profile and potentially avoid unintended weight gain.  The following Lifestyle Medicine recommendations according to [place]  [facility]) were discussed and and offered to patient and she  agrees to start the journey:  A. Whole Foods, Plant-Based Nutrition comprising of fruits and vegetables, plant-based proteins, whole-grain carbohydrates was discussed in detail with the patient.   A list for source of those nutrients were also provided to the patient.  Patient will use only water or unsweetened tea for hydration. B.  The need to stay away from risky substances including alcohol, smoking; obtaining 7 to 9 hours of restorative sleep, at least 150 minutes of moderate intensity exercise weekly, the importance of healthy social connections,  and stress management techniques were discussed. C.  A full color page of  Calorie density of various food groups per pound showing examples of each food groups was provided to the patient.   - she will be scheduled with Jearld Fenton, RDN, CDE for individualized diabetes education.  - I have approached her with the following plan to manage  her diabetes and patient agrees:   -In light of her presentation with near target glycemic profile, she will not need prandial insulin for now.  In fact, she is advised to lower her Lantus to 10 units nightly, associated with monitoring of blood glucose twice a day-daily before breakfast and at bedtime.    - she is warned not to take insulin without proper monitoring per orders.  - she is encouraged to call clinic for  blood glucose levels less than 70 or above 200 mg /dl. - she is advised to continue Metformin '500mg'$  po BID, therapeutically suitable for patient . -She is advised to continue glipizide 5 mg XL p.o. daily at breakfast.     - Specific targets for  A1c;  LDL, HDL,  and Triglycerides were discussed with the patient.  2) Blood Pressure /Hypertension:   -Her blood pressure is controlled to target.  she is advised to continue her current medications including Losartan 50 mg p.o. daily with breakfast .  3) Lipids/Hyperlipidemia:   Review of her recent lipid panel showed controlled LDL at 52.  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  She will be considered for fasting lipid panel at subsequent visits.   4)  Weight/Diet:  Body mass index is 27.28 kg/m.  -Continues to lose weight intentionally.  She is a candidate for some more weight loss.  I discussed with her the fact that loss of 5 - 10% of her  current body weight will have the most impact on her diabetes management.  The above detailed  ACLM recommendations for nutrition, exercise, sleep, social life, avoidance of risky substances, the need for restorative sleep   information will also detailed on discharge instructions.  5) Hypothyroidism: Her previsit thyroid function tests are such that she will benefit from slight increase in her levothyroxine.  I discussed and increase her levothyroxine to 112 mcg p.o. daily before breakfast.     - We discussed about the correct intake of her thyroid hormone, on empty stomach at fasting, with water, separated by at least  30 minutes from breakfast and other medications,  and separated by more than 4 hours from calcium, iron, multivitamins, acid reflux medications (PPIs). -Patient is made aware of the fact that thyroid hormone replacement is needed for life, dose to be adjusted by periodic monitoring of thyroid function tests.   6) Osteoporosis: She is on alendronate 70 mg  po weekly. She did not have recent bone density. She is advised to continue. She will be considered for repeat DXA after her next  visit.  7) Chronic Care/Health Maintenance:  -she  is on ARB  medications and  is encouraged to initiate and continue to follow up with Ophthalmology, Dentist,  Podiatrist at least yearly or according to recommendations, and advised to  stay away from smoking. I have recommended yearly flu vaccine and pneumonia vaccine at least every 5 years; moderate intensity exercise for up to 150 minutes weekly; and  sleep for 7- 9 hours a day.  - she is  advised to maintain close follow up with Andres Shad, MD for primary care needs, as well as her other providers for optimal and coordinated care.  I spent 41 minutes in the care of the patient today including review of labs from [place], Lipids, Thyroid Function, Hematology (current and previous including abstractions from other facilities); face-to-face time discussing  her blood glucose readings/logs, discussing hypoglycemia and hyperglycemia episodes and symptoms, medications doses, her options of short and long term treatment based on the latest standards of care / guidelines;  discussion about incorporating lifestyle medicine;  and documenting the encounter. Risk reduction counseling performed per USPSTF guidelines to reduce  cardiovascular risk factors.     Please refer to Patient Instructions for Blood Glucose Monitoring and Insulin/Medications Dosing Guide"  in media tab for additional information. Please  also refer to " Patient Self Inventory" in the Media  tab for reviewed elements of pertinent patient history.  Priscilla Bowers participated in the discussions, expressed understanding, and voiced agreement with the above plans.  All questions were answered to her satisfaction. she is encouraged to contact clinic should she have any questions or concerns prior to her return visit.    Follow up plan: - Return in about 4  months (around 10/26/2022) for F/U with Pre-visit Labs, Meter/CGM/Logs, A1c here.  Glade Lloyd, MD [facility] [address] [place], [place] [address] Phone: [phone]  Fax: [phone]    06/27/2022, 11:35 PM  This note was partially dictated with voice recognition software. Similar sounding words can be transcribed inadequately or may not  be corrected upon review.

## 2022-09-15 ENCOUNTER — Other Ambulatory Visit: Payer: Self-pay | Admitting: "Endocrinology

## 2022-10-26 ENCOUNTER — Ambulatory Visit (INDEPENDENT_AMBULATORY_CARE_PROVIDER_SITE_OTHER): Payer: Medicaid Other | Admitting: "Endocrinology

## 2022-10-26 ENCOUNTER — Encounter: Payer: Self-pay | Admitting: "Endocrinology

## 2022-10-26 VITALS — BP 130/74 | HR 100 | Ht 66.0 in | Wt 169.2 lb

## 2022-10-26 DIAGNOSIS — E1165 Type 2 diabetes mellitus with hyperglycemia: Secondary | ICD-10-CM

## 2022-10-26 DIAGNOSIS — E782 Mixed hyperlipidemia: Secondary | ICD-10-CM | POA: Diagnosis not present

## 2022-10-26 DIAGNOSIS — I10 Essential (primary) hypertension: Secondary | ICD-10-CM

## 2022-10-26 DIAGNOSIS — E039 Hypothyroidism, unspecified: Secondary | ICD-10-CM

## 2022-10-26 DIAGNOSIS — Z794 Long term (current) use of insulin: Secondary | ICD-10-CM | POA: Diagnosis not present

## 2022-10-26 LAB — POCT GLYCOSYLATED HEMOGLOBIN (HGB A1C): HbA1c, POC (controlled diabetic range): 6.9 % (ref 0.0–7.0)

## 2022-10-26 NOTE — Patient Instructions (Signed)

## 2022-10-26 NOTE — Progress Notes (Signed)
10/26/2022, 5:42 PM  Endocrinology follow-up note   Subjective:    Patient ID: Priscilla Bowers, female    DOB: 1951/05/14.  Priscilla Bowers is being seen in follow-up after she was seen in consultation for management of currently uncontrolled symptomatic diabetes requested by  Arlina Robes, MD.   Past Medical History:  Diagnosis Date   Asthma    Diabetes mellitus, type II (HCC)    Dyspnea    Hyperlipidemia    Hypertension    Hypothyroidism     Past Surgical History:  Procedure Laterality Date   EYE SURGERY     PARATHYROIDECTOMY Left 04/23/2018   Procedure: LEFT SUPERIOR AND INFERIOR PARATHYROIDECTOMY;  Surgeon: Darnell Level, MD;  Location: [facility] OR;  Service: General;  Laterality: Left;   TONSILLECTOMY     TUBAL LIGATION      Social History   Socioeconomic History   Marital status: Unknown    Spouse name: Not on file   Number of children: Not on file   Years of education: Not on file   Highest education level: Not on file  Occupational History   Not on file  Tobacco Use   Smoking status: Never   Smokeless tobacco: Never  Vaping Use   Vaping Use: Never used  Substance and Sexual Activity   Alcohol use: No   Drug use: No   Sexual activity: Not on file  Other Topics Concern   Not on file  Social History Narrative   Not on file   Social Determinants of Health   Financial Resource Strain: Not on file  Food Insecurity: Not on file  Transportation Needs: Not on file  Physical Activity: Not on file  Stress: Not on file  Social Connections: Not on file    Family History  Problem Relation Age of Onset   Hypertension Mother    Diabetes Mother    Heart attack Mother    Cancer Mother     Outpatient Encounter Medications as of 10/26/2022  Medication Sig   alendronate (FOSAMAX) 70 MG tablet TAKE ONE (1) TABLET BY MOUTH EVERY WEEK  WITH A FULL GLASS OF WATER ON AN EMPTY STOMACH     amLODipine (NORVASC) 5 MG tablet Take 5 mg by mouth daily.   Blood Glucose Monitoring Suppl (ACCU-CHEK GUIDE) w/Device KIT 1 Piece by Does not apply route as directed. (Patient not taking: Reported on 04/11/2018)   escitalopram (LEXAPRO) 10 MG tablet Take 10 mg by mouth daily.   glipiZIDE (GLUCOTROL XL) 5 MG 24 hr tablet TAKE ONE (1) TABLET (5 MG TOTAL) BY MOUTH DAILY WITH BREAKFAST.   glucose blood (ACCU-CHEK GUIDE) test strip Use as instructed (Patient not taking: Reported on 04/11/2018)   levothyroxine (SYNTHROID) 112 MCG tablet Take 1 tablet (112 mcg total) by mouth daily before breakfast.   losartan (COZAAR) 50 MG tablet Take 50 mg by mouth daily.   meclizine (ANTIVERT) 25 MG tablet Take 25 mg by mouth 3 (three) times daily as needed for dizziness.    metFORMIN (GLUCOPHAGE) 500 MG tablet Take 500 mg by mouth daily with breakfast.   metoprolol tartrate (LOPRESSOR) 25 MG tablet Take  25 mg by mouth 2 (two) times daily.   omeprazole (PRILOSEC) 40 MG capsule Take 40 mg by mouth daily.   Vitamin D, Ergocalciferol, (DRISDOL) 1.25 MG (50000 UT) CAPS capsule TAKE 1 CAPSULE BY MOUTH EVERY WEEK   [DISCONTINUED] LANTUS SOLOSTAR 100 UNIT/ML Solostar Pen Inject 10 Units into the skin at bedtime.   No facility-administered encounter medications on file as of 10/26/2022.    ALLERGIES: No Known Allergies  VACCINATION STATUS:  There is no immunization history on file for this patient.  Diabetes She presents for her follow-up diabetic visit. She has type 2 diabetes mellitus. Onset time: she was diagnosed at approx age of 71 yrs. Her disease course has been improving (this patient was previously seen in consult for hyperparathyroidism, hypothyroidism, osteoporosis). There are no hypoglycemic associated symptoms. Pertinent negatives for hypoglycemia include no confusion, headaches, pallor or seizures. Associated symptoms include fatigue. Pertinent negatives for diabetes include no chest pain, no  polydipsia, no polyphagia and no polyuria. There are no hypoglycemic complications. Symptoms are improving. Risk factors for coronary artery disease include hypertension, obesity, family history, sedentary lifestyle and post-menopausal. Current diabetic treatment includes insulin injections (she is on Lantus 13 units, glimepiride 4 mg po BID, and mtf 500 mg x 2). Her weight is stable. She is following a generally unhealthy diet. When asked about meal planning, she reported none. She has not had a previous visit with a dietitian. She never participates in exercise. Her home blood glucose trend is decreasing steadily. Her breakfast blood glucose range is generally 130-140 mg/dl. Her bedtime blood glucose range is generally 130-140 mg/dl. Her overall blood glucose range is 130-140 mg/dl. (Priscilla Bowers continues to achieve remarkable results with a change in her lifestyle.  Her point-of-care A1c is 6.9%, generally improving from 15.4% a year ago.  She did not document hypoglycemia.  Her blood glucose readings are near target.    ) An ACE inhibitor/angiotensin II receptor blocker is being taken.  Hypertension This is a chronic problem. The current episode started more than 1 year ago. The problem is controlled. Pertinent negatives include no chest pain, headaches, palpitations or shortness of breath. Risk factors for coronary artery disease include diabetes mellitus, obesity, sedentary lifestyle and post-menopausal state. Past treatments include ACE inhibitors.  Hyperlipidemia This is a chronic problem. The current episode started more than 1 year ago. Exacerbating diseases include diabetes and hypothyroidism. Pertinent negatives include no chest pain, myalgias or shortness of breath. Risk factors for coronary artery disease include dyslipidemia, diabetes mellitus, hypertension, obesity, a sedentary lifestyle and post-menopausal.     Review of Systems  Constitutional:  Positive for fatigue. Negative for chills,  fever and unexpected weight change.  HENT:  Negative for trouble swallowing and voice change.   Eyes:  Negative for visual disturbance.  Respiratory:  Negative for cough, shortness of breath and wheezing.   Cardiovascular:  Negative for chest pain, palpitations and leg swelling.  Gastrointestinal:  Negative for diarrhea, nausea and vomiting.  Endocrine: Negative for cold intolerance, heat intolerance, polydipsia, polyphagia and polyuria.  Musculoskeletal:  Negative for arthralgias and myalgias.  Skin:  Negative for color change, pallor, rash and wound.  Neurological:  Negative for seizures and headaches.  Psychiatric/Behavioral:  Negative for confusion and suicidal ideas.     Objective:       10/26/2022   11:07 AM 06/27/2022   10:27 AM 01/26/2022    3:09 PM  Vitals with BMI  Height 5\' 6"  5\' 6"  5\' 6"   Weight 169  lbs 3 oz 169 lbs 174 lbs 3 oz  BMI 27.32 27.29 28.13  Systolic 130 136   Diastolic 74 82   Pulse 100 92     BP 130/74   Pulse 100   Ht 5\' 6"  (1.676 m)   Wt 169 lb 3.2 oz (76.7 kg)   BMI 27.31 kg/m   Wt Readings from Last 3 Encounters:  10/26/22 169 lb 3.2 oz (76.7 kg)  06/27/22 169 lb (76.7 kg)  01/26/22 174 lb 3.2 oz (79 kg)      CMP ( most recent) CMP     Component Value Date/Time   NA 142 04/29/2022 0000   K 4.4 04/29/2022 0000   CL 110 (A) 04/29/2022 0000   CO2 26 (A) 04/29/2022 0000   GLUCOSE 303 (H) 04/16/2018 1018   BUN 11 04/29/2022 0000   CREATININE 0.8 04/29/2022 0000   CREATININE 0.83 04/16/2018 1018   CALCIUM 8.6 (A) 04/29/2022 0000   ALBUMIN 3.3 (A) 04/29/2022 0000   AST 5 (A) 04/29/2022 0000   ALT 10 04/29/2022 0000   ALKPHOS 39 04/29/2022 0000   GFRNONAA >60 04/16/2018 1018   GFRAA >60 04/16/2018 1018     Diabetic Labs (most recent): Lab Results  Component Value Date   HGBA1C 6.9 10/26/2022   HGBA1C 7.0 06/27/2022   HGBA1C 9.3 (A) 01/18/2022     Lipid Panel ( most recent) Lipid Panel     Component Value Date/Time   CHOL 172  04/29/2022 0000   TRIG 258 (A) 04/29/2022 0000   HDL 41 04/29/2022 0000   LDLCALC 79 04/29/2022 0000      Lab Results  Component Value Date   TSH 5.65 04/29/2022   TSH 2.41 06/08/2017      Assessment & Plan:   1. Type 2 diabetes mellitus with hyperglycemia, with long-term current use of insulin (HCC)   - Priscilla Bowers has currently uncontrolled symptomatic type 2 DM since  72 years of age.  Priscilla Bowers continues to achieve remarkable results with a change in her lifestyle.  Her point-of-care A1c is 6.9%, generally improving from 15.4% a year ago.  She did not document hypoglycemia.  Her blood glucose readings are near target.    Recent labs reviewed. - I had a long discussion with her about the progressive nature of diabetes and the pathology behind its complications. -her diabetes is complicated by comorbid conditions including HTN, HPL, sedentary life and she remains at a high risk for more acute and chronic complications which include CAD, CVA, CKD, retinopathy, and neuropathy. These are all discussed in detail with her.  - I discussed all available options of managing her diabetes including de-escalation of medications. I have counseled her on diet  and weight management  by adopting a Whole Food , Plant Predominant  ( WFPP) nutrition as recommended by [employer] of Lifestyle Medicine. Patient is encouraged to switch to  unprocessed or minimally processed  complex starch, adequate protein intake (mainly plant source), minimal liquid fat ( mainly vegetable oils), plenty of fruits, and vegetables. -  she is advised to stick to a routine mealtimes to eat 3 complete meals a day and snack only when necessary ( to snack only to correct hypoglycemia BG <70 day time or <100 at night).   She made several important changes in her lifestyle.  She is generating tremendous results on her diabetes, hyperlipidemia, and her weight.   - Suggestion is made for her to avoid simple  carbohydrates  from her diet including Cakes, Sweet Desserts, Ice Cream, Soda (diet and regular), Sweet Tea, Candies, Chips, Cookies, Store Bought Juices, Alcohol , Artificial Sweeteners,  Coffee Creamer, and "Sugar-free" Products, Lemonade. This will help patient to have more stable blood glucose profile and potentially avoid unintended weight gain.  The following Lifestyle Medicine recommendations according to American College of Lifestyle Medicine  [facility]) were discussed and and offered to patient and she  agrees to start the journey:  A. Whole Foods, Plant-Based Nutrition comprising of fruits and vegetables, plant-based proteins, whole-grain carbohydrates was discussed in detail with the patient.   A list for source of those nutrients were also provided to the patient.  Patient will use only water or unsweetened tea for hydration. B.  The need to stay away from risky substances including alcohol, smoking; obtaining 7 to 9 hours of restorative sleep, at least 150 minutes of moderate intensity exercise weekly, the importance of healthy social connections,  and stress management techniques were discussed. C.  A full color page of  Calorie density of various food groups per pound showing examples of each food groups was provided to the patient.   - I have approached her with the following plan to manage  her diabetes and patient agrees:   -In light of her presentation with near target glycemic profile, she is given a chance to come off of Lantus.  She is advised to continue metformin 500 mg p.o. twice daily, advised to continue glipizide 5 mg p.o. daily at breakfast.  She is willing and advised to continue monitor blood glucose twice a day-daily before breakfast and at bedtime.   - she is warned not to take insulin without proper monitoring per orders.  - she is encouraged to call clinic for blood glucose levels less than 70 or above 200 mg /dl. -She will  be considered for GLP-1 receptor agonist or  SGLT2 inhibitors if she loses control.   - Specific targets for  A1c;  LDL, HDL,  and Triglycerides were discussed with the patient.  2) Blood Pressure /Hypertension:   Her blood pressure is controlled to target.  she is advised to continue her current medications including Losartan 50 mg p.o. daily with breakfast .  3) Lipids/Hyperlipidemia:   Review of her recent lipid panel showed controlled LDL at 79.  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  Whole food plant-based diet is helping her achieve this.     4)  Weight/Diet:  Body mass index is 27.31 kg/m.  -Continues to lose weight intentionally.  She is a candidate for some more weight loss.  I discussed with her the fact that loss of 5 - 10% of her  current body weight will have the most impact on her diabetes management.  The above detailed  ACLM recommendations for nutrition, exercise, sleep, social life, avoidance of risky substances, the need for restorative sleep   information will also detailed on discharge instructions.  5) Hypothyroidism: Her previsit thyroid function tests are consistent with appropriate replacement.  She is advised to continue levothyroxine 112 mcg p.o. daily before breakfast.     - We discussed about the correct intake of her thyroid hormone, on empty stomach at fasting, with water, separated by at least 30 minutes from breakfast and other medications,  and separated by more than 4 hours from calcium, iron, multivitamins, acid reflux medications (PPIs). -Patient is made aware of the fact that thyroid hormone replacement is needed for life, dose  to be adjusted by periodic monitoring of thyroid function tests.    6) Osteoporosis: She is on alendronate 70 mg po weekly. She did not have recent bone density. She is advised to continue.  She is advised to request for a follow-up bone density during her annual physical with her PCP.     7) Chronic Care/Health Maintenance:  -she  is on ARB   medications and  is encouraged to initiate and continue to follow up with Ophthalmology, Dentist,  Podiatrist at least yearly or according to recommendations, and advised to  stay away from smoking. I have recommended yearly flu vaccine and pneumonia vaccine at least every 5 years; moderate intensity exercise for up to 150 minutes weekly; and  sleep for 7- 9 hours a day.  Her diabetes foot exam was normal Oct 28, 2022 .  - she is  advised to maintain close follow up with Arlina Robes, MD for primary care needs, as well as her other providers for optimal and coordinated care.   I spent  41  minutes in the care of the patient today including review of labs from CMP, Lipids, Thyroid Function, Hematology (current and previous including abstractions from other facilities); face-to-face time discussing  her blood glucose readings/logs, discussing hypoglycemia and hyperglycemia episodes and symptoms, medications doses, her options of short and long term treatment based on the latest standards of care / guidelines;  discussion about incorporating lifestyle medicine;  and documenting the encounter. Risk reduction counseling performed per USPSTF guidelines to reduce  obesity and cardiovascular risk factors.     Please refer to Patient Instructions for Blood Glucose Monitoring and Insulin/Medications Dosing Guide"  in media tab for additional information. Please  also refer to " Patient Self Inventory" in the Media  tab for reviewed elements of pertinent patient history.  Priscilla Bowers participated in the discussions, expressed understanding, and voiced agreement with the above plans.  All questions were answered to her satisfaction. she is encouraged to contact clinic should she have any questions or concerns prior to her return visit.     Follow up plan: - Return in about 4 months (around 02/26/2023) for F/U with Pre-visit Labs, Meter/CGM/Logs, A1c here.  Marquis Lunch, MD [facility]  [facility] [address] [place], [place] [address] Phone: [phone]  Fax: [phone]    10/26/2022, 5:42 PM  This note was partially dictated with voice recognition software. Similar sounding words can be transcribed inadequately or may not  be corrected upon review.

## 2023-02-28 ENCOUNTER — Ambulatory Visit: Payer: Medicare (Managed Care) | Admitting: "Endocrinology

## 2023-04-20 LAB — LIPID PANEL
Cholesterol: 232 — AB (ref 0–200)
HDL: 44 (ref 35–70)
LDL Cholesterol: 128
Triglycerides: 302 — AB (ref 40–160)

## 2023-04-20 LAB — TSH: TSH: 1.95 (ref 0.41–5.90)

## 2023-04-20 LAB — BASIC METABOLIC PANEL
BUN: 14 (ref 4–21)
CO2: 24 — AB (ref 13–22)
Chloride: 106 (ref 99–108)
Creatinine: 0.8 (ref 0.5–1.1)
EGFR: 81
Glucose: 263
Potassium: 4.5 meq/L (ref 3.5–5.1)
Sodium: 135 — AB (ref 137–147)

## 2023-04-20 LAB — HEPATIC FUNCTION PANEL
ALT: 10 U/L (ref 7–35)
AST: 5 — AB (ref 13–35)
Bilirubin, Total: 0.6

## 2023-04-20 LAB — COMPREHENSIVE METABOLIC PANEL
Albumin: 3.5 (ref 3.5–5.0)
Calcium: 8.9 (ref 8.7–10.7)

## 2023-04-26 ENCOUNTER — Ambulatory Visit: Payer: Medicare Other | Admitting: "Endocrinology

## 2023-04-27 ENCOUNTER — Encounter: Payer: Self-pay | Admitting: "Endocrinology

## 2023-05-22 ENCOUNTER — Encounter: Payer: Self-pay | Admitting: "Endocrinology

## 2023-05-22 ENCOUNTER — Ambulatory Visit (INDEPENDENT_AMBULATORY_CARE_PROVIDER_SITE_OTHER): Payer: Medicare Other | Admitting: "Endocrinology

## 2023-05-22 VITALS — BP 138/78 | HR 96 | Ht 66.0 in | Wt 164.8 lb

## 2023-05-22 DIAGNOSIS — E039 Hypothyroidism, unspecified: Secondary | ICD-10-CM

## 2023-05-22 DIAGNOSIS — I10 Essential (primary) hypertension: Secondary | ICD-10-CM

## 2023-05-22 DIAGNOSIS — E1165 Type 2 diabetes mellitus with hyperglycemia: Secondary | ICD-10-CM

## 2023-05-22 DIAGNOSIS — E782 Mixed hyperlipidemia: Secondary | ICD-10-CM

## 2023-05-22 DIAGNOSIS — Z794 Long term (current) use of insulin: Secondary | ICD-10-CM | POA: Diagnosis not present

## 2023-05-22 LAB — POCT GLYCOSYLATED HEMOGLOBIN (HGB A1C): HbA1c, POC (controlled diabetic range): 13.9 % — AB (ref 0.0–7.0)

## 2023-05-22 MED ORDER — LANTUS SOLOSTAR 100 UNIT/ML ~~LOC~~ SOPN: 20.0000 [IU] | PEN_INJECTOR | Freq: Every day | SUBCUTANEOUS | 1 refills | Status: DC

## 2023-05-22 MED ORDER — ROSUVASTATIN CALCIUM 10 MG PO TABS: 10.0000 mg | ORAL_TABLET | Freq: Every day | ORAL | 1 refills | Status: AC

## 2023-05-22 NOTE — Progress Notes (Signed)
05/22/2023, 4:44 PM  Endocrinology follow-up note   Subjective:    Patient ID: Priscilla Bowers, female    DOB: [birth year].  Priscilla Bowers is being seen in follow-up after she was seen in consultation for management of currently uncontrolled symptomatic diabetes requested by  Arlina Robes, MD.   Past Medical History:  Diagnosis Date   Asthma    Diabetes mellitus, type II (HCC)    Dyspnea    Hyperlipidemia    Hypertension    Hypothyroidism     Past Surgical History:  Procedure Laterality Date   EYE SURGERY     PARATHYROIDECTOMY Left 04/23/2018   Procedure: LEFT SUPERIOR AND INFERIOR PARATHYROIDECTOMY;  Surgeon: Darnell Level, MD;  Location: [facility] OR;  Service: General;  Laterality: Left;   TONSILLECTOMY     TUBAL LIGATION      Social History   Socioeconomic History   Marital status: Unknown    Spouse name: Not on file   Number of children: Not on file   Years of education: Not on file   Highest education level: Not on file  Occupational History   Not on file  Tobacco Use   Smoking status: Never   Smokeless tobacco: Never  Vaping Use   Vaping status: Never Used  Substance and Sexual Activity   Alcohol use: No   Drug use: No   Sexual activity: Not on file  Other Topics Concern   Not on file  Social History Narrative   Not on file   Social Determinants of Health   Financial Resource Strain: Not on file  Food Insecurity: Not on file  Transportation Needs: Not on file  Physical Activity: Not on file  Stress: Not on file  Social Connections: Not on file    Family History  Problem Relation Age of Onset   Hypertension Mother    Diabetes Mother    Heart attack Mother    Cancer Mother     Outpatient Encounter Medications as of 05/22/2023  Medication Sig   insulin glargine (LANTUS SOLOSTAR) 100 UNIT/ML Solostar Pen Inject 20 Units into the skin at bedtime.   rosuvastatin  (CRESTOR) 10 MG tablet Take 1 tablet (10 mg total) by mouth at bedtime.   alendronate (FOSAMAX) 70 MG tablet TAKE ONE (1) TABLET BY MOUTH EVERY WEEK  WITH A FULL GLASS OF WATER ON AN EMPTY STOMACH    amLODipine (NORVASC) 5 MG tablet Take 5 mg by mouth daily.   Blood Glucose Monitoring Suppl (ACCU-CHEK GUIDE) w/Device KIT 1 Piece by Does not apply route as directed. (Patient not taking: Reported on 04/11/2018)   escitalopram (LEXAPRO) 10 MG tablet Take 10 mg by mouth daily.   glipiZIDE (GLUCOTROL XL) 5 MG 24 hr tablet TAKE ONE (1) TABLET (5 MG TOTAL) BY MOUTH DAILY WITH BREAKFAST.   glucose blood (ACCU-CHEK GUIDE) test strip Use as instructed (Patient not taking: Reported on 04/11/2018)   levothyroxine (SYNTHROID) 112 MCG tablet Take 1 tablet (112 mcg total) by mouth daily before breakfast.   losartan (COZAAR) 50 MG tablet Take 50 mg by mouth daily.   meclizine (ANTIVERT) 25 MG tablet Take 25 mg  by mouth 3 (three) times daily as needed for dizziness.    metFORMIN (GLUCOPHAGE) 500 MG tablet Take 500 mg by mouth daily with breakfast.   metoprolol tartrate (LOPRESSOR) 25 MG tablet Take 25 mg by mouth 2 (two) times daily.   omeprazole (PRILOSEC) 40 MG capsule Take 40 mg by mouth daily.   Vitamin D, Ergocalciferol, (DRISDOL) 1.25 MG (50000 UT) CAPS capsule TAKE 1 CAPSULE BY MOUTH EVERY WEEK   No facility-administered encounter medications on file as of 05/22/2023.    ALLERGIES: No Known Allergies  VACCINATION STATUS:  There is no immunization history on file for this patient.  Diabetes She presents for her follow-up diabetic visit. She has type 2 diabetes mellitus. Onset time: she was diagnosed at approx age of 42 yrs. Her disease course has been worsening (this patient was previously seen in consult for hyperparathyroidism, hypothyroidism, osteoporosis). There are no hypoglycemic associated symptoms. Pertinent negatives for hypoglycemia include no confusion, headaches, pallor or seizures.  Associated symptoms include fatigue, polydipsia and polyuria. Pertinent negatives for diabetes include no chest pain and no polyphagia. There are no hypoglycemic complications. Symptoms are worsening. Risk factors for coronary artery disease include hypertension, obesity, family history, sedentary lifestyle and post-menopausal. Current diabetic treatment includes insulin injections (she is on Lantus 13 units, glimepiride 4 mg po BID, and mtf 500 mg x 2). Her weight is decreasing steadily. She is following a generally unhealthy diet. When asked about meal planning, she reported none. She has not had a previous visit with a dietitian. She never participates in exercise. (Priscilla Bowers did not bring any logs nor meter with her.  Her point-of-care A1c is significantly higher at 13.9% increasing from 6.9% during her last visit.   ) An ACE inhibitor/angiotensin II receptor blocker is being taken.  Hypertension This is a chronic problem. The current episode started more than 1 year ago. The problem is controlled. Pertinent negatives include no chest pain, headaches, palpitations or shortness of breath. Risk factors for coronary artery disease include diabetes mellitus, obesity, sedentary lifestyle and post-menopausal state. Past treatments include ACE inhibitors.  Hyperlipidemia This is a chronic problem. The current episode started more than 1 year ago. Exacerbating diseases include diabetes and hypothyroidism. Pertinent negatives include no chest pain, myalgias or shortness of breath. Risk factors for coronary artery disease include dyslipidemia, diabetes mellitus, hypertension, obesity, a sedentary lifestyle and post-menopausal.     Review of Systems  Constitutional:  Positive for fatigue. Negative for chills, fever and unexpected weight change.  HENT:  Negative for trouble swallowing and voice change.   Eyes:  Negative for visual disturbance.  Respiratory:  Negative for cough, shortness of breath and  wheezing.   Cardiovascular:  Negative for chest pain, palpitations and leg swelling.  Gastrointestinal:  Negative for diarrhea, nausea and vomiting.  Endocrine: Positive for polydipsia and polyuria. Negative for cold intolerance, heat intolerance and polyphagia.  Musculoskeletal:  Negative for arthralgias and myalgias.  Skin:  Negative for color change, pallor, rash and wound.  Neurological:  Negative for seizures and headaches.  Psychiatric/Behavioral:  Negative for confusion and suicidal ideas.     Objective:       05/22/2023    1:41 PM 10/26/2022   11:07 AM 06/27/2022   10:27 AM  Vitals with BMI  Height 5\' 6"  5\' 6"  5\' 6"   Weight 164 lbs 13 oz 169 lbs 3 oz 169 lbs  BMI 26.61 27.32 27.29  Systolic 138 130 098  Diastolic 78 74 82  Pulse 96  100 92    BP 138/78   Pulse 96   Ht 5\' 6"  (1.676 m)   Wt 164 lb 12.8 oz (74.8 kg)   BMI 26.60 kg/m   Wt Readings from Last 3 Encounters:  05/22/23 164 lb 12.8 oz (74.8 kg)  10/26/22 169 lb 3.2 oz (76.7 kg)  06/27/22 169 lb (76.7 kg)      CMP ( most recent) CMP     Component Value Date/Time   NA 135 (A) 04/20/2023 0000   K 4.5 04/20/2023 0000   CL 106 04/20/2023 0000   CO2 24 (A) 04/20/2023 0000   GLUCOSE 303 (H) 04/16/2018 1018   BUN 14 04/20/2023 0000   CREATININE 0.8 04/20/2023 0000   CREATININE 0.83 04/16/2018 1018   CALCIUM 8.9 04/20/2023 0000   ALBUMIN 3.5 04/20/2023 0000   AST 5 (A) 04/20/2023 0000   ALT 10 04/20/2023 0000   ALKPHOS 39 04/29/2022 0000   GFRNONAA >60 04/16/2018 1018   GFRAA >60 04/16/2018 1018     Diabetic Labs (most recent): Lab Results  Component Value Date   HGBA1C 13.9 (A) 05/22/2023   HGBA1C 6.9 10/26/2022   HGBA1C 7.0 06/27/2022     Lipid Panel ( most recent) Lipid Panel     Component Value Date/Time   CHOL 232 (A) 04/20/2023 0000   TRIG 302 (A) 04/20/2023 0000   HDL 44 04/20/2023 0000   LDLCALC 128 04/20/2023 0000      Lab Results  Component Value Date   TSH 1.95 04/20/2023    TSH 5.65 04/29/2022   TSH 2.41 06/08/2017      Assessment & Plan:   1. Type 2 diabetes mellitus with hyperglycemia, with long-term current use of insulin (HCC)   - Priscilla Bowers has currently uncontrolled symptomatic type 2 DM since  72 years of age.  Priscilla Bowers did not bring any logs nor meter with her.  Her point-of-care A1c is significantly higher at 13.9% increasing from 6.9% during her last visit.    Recent labs reviewed. - I had a long discussion with her about the progressive nature of diabetes and the pathology behind its complications. -her diabetes is complicated by comorbid conditions including HTN, HPL, sedentary life and she remains at a high risk for more acute and chronic complications which include CAD, CVA, CKD, retinopathy, and neuropathy. These are all discussed in detail with her.  - I discussed all available options of managing her diabetes including de-escalation of medications. I have counseled her on diet  and weight management  by adopting a Whole Food , Plant Predominant  ( WFPP) nutrition as recommended by [employer] of Lifestyle Medicine. Patient is encouraged to switch to  unprocessed or minimally processed  complex starch, adequate protein intake (mainly plant source), minimal liquid fat ( mainly vegetable oils), plenty of fruits, and vegetables. -  she is advised to stick to a routine mealtimes to eat 3 complete meals a day and snack only when necessary ( to snack only to correct hypoglycemia BG <70 day time or <100 at night).   She made several important changes in her lifestyle.  She is generating tremendous results on her diabetes, hyperlipidemia, and her weight.  - she acknowledges that there is a room for improvement in her food and drink choices. - Suggestion is made for her to avoid simple carbohydrates  from her diet including Cakes, Sweet Desserts, Ice Cream, Soda (diet and regular), Sweet Tea, Candies, Chips, Cookies, Store Bought Juices,  Alcohol , Artificial Sweeteners,  Coffee Creamer, and "Sugar-free" Products, Lemonade. This will help patient to have more stable blood glucose profile and potentially avoid unintended weight gain.  The following Lifestyle Medicine recommendations according to American College of Lifestyle Medicine  [facility]) were discussed and and offered to patient and she  agrees to start the journey:  A. Whole Foods, Plant-Based Nutrition comprising of fruits and vegetables, plant-based proteins, whole-grain carbohydrates was discussed in detail with the patient.   A list for source of those nutrients were also provided to the patient.  Patient will use only water or unsweetened tea for hydration. B.  The need to stay away from risky substances including alcohol, smoking; obtaining 7 to 9 hours of restorative sleep, at least 150 minutes of moderate intensity exercise weekly, the importance of healthy social connections,  and stress management techniques were discussed. C.  A full color page of  Calorie density of various food groups per pound showing examples of each food groups was provided to the patient.   - I have approached her with the following plan to manage  her diabetes and patient agrees:   -In light of her point-of-care A1c of 13.9% increasing from 6.9%, she is advised to restart Lantus 20 units nightly, continue metformin 500 mg p.o. daily, continue glipizide 5 mg p.o. daily at breakfast.    She is willing and advised to continue monitor blood glucose twice a day-daily before breakfast and at bedtime and return in 2 weeks for reevaluation.  - she is encouraged to call clinic for blood glucose levels less than 70 or above 200 mg /dl. -She will  be considered for GLP-1 receptor agonist or SGLT2 inhibitors if she loses control.   - Specific targets for  A1c;  LDL, HDL,  and Triglycerides were discussed with the patient.  2) Blood Pressure /Hypertension:   -Her blood pressure is controlled to target.   she is advised to continue her current medications including Losartan 50 mg p.o. daily with breakfast .  3) Lipids/Hyperlipidemia:   Review of her recent lipid panel showed uncontrolled LDL increasing from 79-1 28.  She is given prescription for Crestor 10 mg p.o. nightly.  Side effects and precautions discussed with her.    Whole food plant-based diet is helping her achieve this.     4)  Weight/Diet:  Body mass index is 26.6 kg/m.  -Continues to lose weight intentionally.  She is a candidate for some more weight loss.  I discussed with her the fact that loss of 5 - 10% of her  current body weight will have the most impact on her diabetes management.  The above detailed  ACLM recommendations for nutrition, exercise, sleep, social life, avoidance of risky substances, the need for restorative sleep   information will also detailed on discharge instructions.  5) Hypothyroidism: Her previsit thyroid function tests are consistent with appropriate replacement.  She is advised to continue levothyroxine 112 mcg p.o. daily before breakfast.     - We discussed about the correct intake of her thyroid hormone, on empty stomach at fasting, with water, separated by at least 30 minutes from breakfast and other medications,  and separated by more than 4 hours from calcium, iron, multivitamins, acid reflux medications (PPIs). -Patient is made aware of the fact that thyroid hormone replacement is needed for life, dose to be adjusted by periodic monitoring of thyroid function tests.    6) Osteoporosis: She is on alendronate 70 mg po weekly.  She did not have recent bone density. She is advised to continue.  She is advised to request for a follow-up bone density during her annual physical with her PCP.     7) Chronic Care/Health Maintenance:  -she  is on ARB  medications and  is encouraged to initiate and continue to follow up with Ophthalmology, Dentist,  Podiatrist at least yearly or according to recommendations,  and advised to  stay away from smoking. I have recommended yearly flu vaccine and pneumonia vaccine at least every 5 years; moderate intensity exercise for up to 150 minutes weekly; and  sleep for 7- 9 hours a day.  Her diabetes foot exam was normal Oct 28, 2022 .  - she is  advised to maintain close follow up with Arlina Robes, MD for primary care needs, as well as her other providers for optimal and coordinated care.   I spent  26  minutes in the care of the patient today including review of labs from CMP, Lipids, Thyroid Function, Hematology (current and previous including abstractions from other facilities); face-to-face time discussing  her blood glucose readings/logs, discussing hypoglycemia and hyperglycemia episodes and symptoms, medications doses, her options of short and long term treatment based on the latest standards of care / guidelines;  discussion about incorporating lifestyle medicine;  and documenting the encounter. Risk reduction counseling performed per USPSTF guidelines to reduce  cardiovascular risk factors.     Please refer to Patient Instructions for Blood Glucose Monitoring and Insulin/Medications Dosing Guide"  in media tab for additional information. Please  also refer to " Patient Self Inventory" in the Media  tab for reviewed elements of pertinent patient history.  Priscilla Bowers participated in the discussions, expressed understanding, and voiced agreement with the above plans.  All questions were answered to her satisfaction. she is encouraged to contact clinic should she have any questions or concerns prior to her return visit.   Follow up plan: - Return in about 2 weeks (around 06/05/2023) for Bring Meter/CGM Device/Logs- A1c in Office.  Marquis Lunch, MD [facility] [address] [place], [place] [address] Phone: [phone]  Fax: [phone]    05/22/2023, 4:45 PM  This note was partially  dictated with voice recognition software. Similar sounding words can be transcribed inadequately or may not  be corrected upon review.

## 2023-05-22 NOTE — Patient Instructions (Signed)

## 2023-06-07 ENCOUNTER — Ambulatory Visit: Payer: Medicare Other | Admitting: "Endocrinology

## 2023-06-23 ENCOUNTER — Other Ambulatory Visit: Payer: Self-pay | Admitting: "Endocrinology

## 2023-06-27 ENCOUNTER — Encounter: Payer: Self-pay | Admitting: "Endocrinology

## 2023-06-27 ENCOUNTER — Ambulatory Visit (INDEPENDENT_AMBULATORY_CARE_PROVIDER_SITE_OTHER): Payer: Medicare Other | Admitting: "Endocrinology

## 2023-06-27 VITALS — BP 130/66 | HR 88 | Ht 66.0 in | Wt 170.2 lb

## 2023-06-27 DIAGNOSIS — I10 Essential (primary) hypertension: Secondary | ICD-10-CM | POA: Diagnosis not present

## 2023-06-27 DIAGNOSIS — E782 Mixed hyperlipidemia: Secondary | ICD-10-CM | POA: Diagnosis not present

## 2023-06-27 DIAGNOSIS — E1165 Type 2 diabetes mellitus with hyperglycemia: Secondary | ICD-10-CM | POA: Diagnosis not present

## 2023-06-27 DIAGNOSIS — Z794 Long term (current) use of insulin: Secondary | ICD-10-CM

## 2023-06-27 DIAGNOSIS — E039 Hypothyroidism, unspecified: Secondary | ICD-10-CM | POA: Diagnosis not present

## 2023-06-27 NOTE — Progress Notes (Signed)
 06/27/2023, 2:30 PM  Endocrinology follow-up note   Subjective:    Patient ID: Priscilla Bowers, female    DOB: 1950-08-02.  Priscilla Bowers is being seen in follow-up after she was seen in consultation for management of currently uncontrolled symptomatic diabetes requested by  Marchelle Clem Pitts, MD.   Past Medical History:  Diagnosis Date   Asthma    Diabetes mellitus, type II (HCC)    Dyspnea    Hyperlipidemia    Hypertension    Hypothyroidism     Past Surgical History:  Procedure Laterality Date   EYE SURGERY     PARATHYROIDECTOMY Left 04/23/2018   Procedure: LEFT SUPERIOR AND INFERIOR PARATHYROIDECTOMY;  Surgeon: Eletha Boas, MD;  Location: [facility] OR;  Service: General;  Laterality: Left;   TONSILLECTOMY     TUBAL LIGATION      Social History   Socioeconomic History   Marital status: Unknown    Spouse name: Not on file   Number of children: Not on file   Years of education: Not on file   Highest education level: Not on file  Occupational History   Not on file  Tobacco Use   Smoking status: Never   Smokeless tobacco: Never  Vaping Use   Vaping status: Never Used  Substance and Sexual Activity   Alcohol use: No   Drug use: No   Sexual activity: Not on file  Other Topics Concern   Not on file  Social History Narrative   Not on file   Social Drivers of Health   Financial Resource Strain: Not on file  Food Insecurity: Not on file  Transportation Needs: Not on file  Physical Activity: Not on file  Stress: Not on file  Social Connections: Not on file    Family History  Problem Relation Age of Onset   Hypertension Mother    Diabetes Mother    Heart attack Mother    Cancer Mother     Outpatient Encounter Medications as of 06/27/2023  Medication Sig   alendronate  (FOSAMAX ) 70 MG tablet TAKE ONE (1) TABLET BY MOUTH EVERY WEEK  WITH A FULL GLASS OF WATER ON AN EMPTY STOMACH     amLODipine (NORVASC) 5 MG tablet Take 5 mg by mouth daily.   Blood Glucose Monitoring Suppl (ACCU-CHEK GUIDE) w/Device KIT 1 Piece by Does not apply route as directed. (Patient not taking: Reported on 04/11/2018)   escitalopram (LEXAPRO) 10 MG tablet Take 10 mg by mouth daily.   glipiZIDE  (GLUCOTROL  XL) 5 MG 24 hr tablet TAKE ONE (1) TABLET (5 MG TOTAL) BY MOUTH DAILY WITH BREAKFAST.   glucose blood (ACCU-CHEK GUIDE) test strip Use as instructed (Patient not taking: Reported on 04/11/2018)   insulin glargine  (LANTUS  SOLOSTAR) 100 UNIT/ML Solostar Pen Inject 20 Units into the skin at bedtime.   levothyroxine  (SYNTHROID ) 112 MCG tablet TAKE ONE (1) TABLET (112 MCG TOTAL) BY MOUTH DAILY BEFORE BREAKFAST.   losartan (COZAAR) 50 MG tablet Take 50 mg by mouth daily.   meclizine (ANTIVERT) 25 MG tablet Take 25 mg by mouth 3 (three) times daily as needed for dizziness.    metFORMIN (GLUCOPHAGE) 500  MG tablet Take 500 mg by mouth 2 (two) times daily with a meal.   metoprolol tartrate (LOPRESSOR) 25 MG tablet Take 25 mg by mouth 2 (two) times daily.   omeprazole (PRILOSEC) 40 MG capsule Take 40 mg by mouth daily.   rosuvastatin  (CRESTOR ) 10 MG tablet Take 1 tablet (10 mg total) by mouth at bedtime.   Vitamin D , Ergocalciferol , (DRISDOL ) 1.25 MG (50000 UT) CAPS capsule TAKE 1 CAPSULE BY MOUTH EVERY WEEK   No facility-administered encounter medications on file as of 06/27/2023.    ALLERGIES: No Known Allergies  VACCINATION STATUS:  There is no immunization history on file for this patient.  Diabetes She presents for her follow-up diabetic visit. She has type 2 diabetes mellitus. Onset time: she was diagnosed at approx age of 73 yrs. Her disease course has been improving (this patient was previously seen in consult for hyperparathyroidism, hypothyroidism, osteoporosis). There are no hypoglycemic associated symptoms. Pertinent negatives for hypoglycemia include no confusion, headaches, pallor or seizures.  Associated symptoms include fatigue, polydipsia and polyuria. Pertinent negatives for diabetes include no chest pain and no polyphagia. There are no hypoglycemic complications. Symptoms are improving. Risk factors for coronary artery disease include hypertension, obesity, family history, sedentary lifestyle and post-menopausal. Current diabetic treatment includes insulin injections (she is on Lantus  13 units, glimepiride 4 mg po BID, and mtf 500 mg x 2). Her weight is decreasing steadily. She is following a generally unhealthy diet. When asked about meal planning, she reported none. She has not had a previous visit with a dietitian. She never participates in exercise. Her home blood glucose trend is decreasing steadily. (Ms. Enneking did bring her meter and logs.  Her glycemic profile is significantly improving.  She did not document hypoglycemia.  Her recent A1c was 13.9%.    ) An ACE inhibitor/angiotensin II receptor blocker is being taken.  Hypertension This is a chronic problem. The current episode started more than 1 year ago. The problem is controlled. Pertinent negatives include no chest pain, headaches, palpitations or shortness of breath. Risk factors for coronary artery disease include diabetes mellitus, obesity, sedentary lifestyle and post-menopausal state. Past treatments include ACE inhibitors.  Hyperlipidemia This is a chronic problem. The current episode started more than 1 year ago. Exacerbating diseases include diabetes and hypothyroidism. Pertinent negatives include no chest pain, myalgias or shortness of breath. Risk factors for coronary artery disease include dyslipidemia, diabetes mellitus, hypertension, obesity, a sedentary lifestyle and post-menopausal.    Review of Systems  Constitutional:  Positive for fatigue. Negative for chills, fever and unexpected weight change.  HENT:  Negative for trouble swallowing and voice change.   Eyes:  Negative for visual disturbance.  Respiratory:   Negative for cough, shortness of breath and wheezing.   Cardiovascular:  Negative for chest pain, palpitations and leg swelling.  Gastrointestinal:  Negative for diarrhea, nausea and vomiting.  Endocrine: Positive for polydipsia and polyuria. Negative for cold intolerance, heat intolerance and polyphagia.  Musculoskeletal:  Negative for arthralgias and myalgias.  Skin:  Negative for color change, pallor, rash and wound.  Neurological:  Negative for seizures and headaches.  Psychiatric/Behavioral:  Negative for confusion and suicidal ideas.     Objective:       06/27/2023    2:25 PM 06/27/2023    2:06 PM 05/22/2023    1:41 PM  Vitals with BMI  Height  5' 6 5' 6  Weight  170 lbs 3 oz 164 lbs 13 oz  BMI  27.48 26.61  Systolic 130 144 861  Diastolic 66 78 78  Pulse  88 96    BP 130/66 Comment: L arm with manual cuff.  Pulse 88   Ht 5' 6 (1.676 m)   Wt 170 lb 3.2 oz (77.2 kg)   BMI 27.47 kg/m   Wt Readings from Last 3 Encounters:  06/27/23 170 lb 3.2 oz (77.2 kg)  05/22/23 164 lb 12.8 oz (74.8 kg)  10/26/22 169 lb 3.2 oz (76.7 kg)      CMP ( most recent) CMP     Component Value Date/Time   NA 135 (A) 04/20/2023 0000   K 4.5 04/20/2023 0000   CL 106 04/20/2023 0000   CO2 24 (A) 04/20/2023 0000   GLUCOSE 303 (H) 04/16/2018 1018   BUN 14 04/20/2023 0000   CREATININE 0.8 04/20/2023 0000   CREATININE 0.83 04/16/2018 1018   CALCIUM  8.9 04/20/2023 0000   ALBUMIN 3.5 04/20/2023 0000   AST 5 (A) 04/20/2023 0000   ALT 10 04/20/2023 0000   ALKPHOS 39 04/29/2022 0000   GFRNONAA >60 04/16/2018 1018   GFRAA >60 04/16/2018 1018     Diabetic Labs (most recent): Lab Results  Component Value Date   HGBA1C 13.9 (A) 05/22/2023   HGBA1C 6.9 10/26/2022   HGBA1C 7.0 06/27/2022     Lipid Panel ( most recent) Lipid Panel     Component Value Date/Time   CHOL 232 (A) 04/20/2023 0000   TRIG 302 (A) 04/20/2023 0000   HDL 44 04/20/2023 0000   LDLCALC 128 04/20/2023 0000       Lab Results  Component Value Date   TSH 1.95 04/20/2023   TSH 5.65 04/29/2022   TSH 2.41 06/08/2017      Assessment & Plan:   1. Type 2 diabetes mellitus with hyperglycemia, with long-term current use of insulin (HCC)   - NGUYET MERCER has currently uncontrolled symptomatic type 2 DM since  73 years of age.  Ms. Hakim did bring her meter and logs.  Her glycemic profile is significantly improving.  She did not document hypoglycemia.  Her recent A1c was 13.9%.    Recent labs reviewed. - I had a long discussion with her about the progressive nature of diabetes and the pathology behind its complications. -her diabetes is complicated by comorbid conditions including HTN, HPL, sedentary life and she remains at a high risk for more acute and chronic complications which include CAD, CVA, CKD, retinopathy, and neuropathy. These are all discussed in detail with her.  - I discussed all available options of managing her diabetes including de-escalation of medications. I have counseled her on diet  and weight management  by adopting a Whole Food , Plant Predominant  ( WFPP) nutrition as recommended by [employer] of Lifestyle Medicine. Patient is encouraged to switch to  unprocessed or minimally processed  complex starch, adequate protein intake (mainly plant source), minimal liquid fat ( mainly vegetable oils), plenty of fruits, and vegetables. -  she is advised to stick to a routine mealtimes to eat 3 complete meals a day and snack only when necessary ( to snack only to correct hypoglycemia BG <70 day time or <100 at night).   She made several important changes in her lifestyle.  She is generating tremendous results on her diabetes, hyperlipidemia, and her weight.  - she acknowledges that there is a room for improvement in her food and drink choices. - Suggestion is made for her to avoid simple carbohydrates  from her  diet including Cakes, Sweet Desserts, Ice Cream, Soda (diet and  regular), Sweet Tea, Candies, Chips, Cookies, Store Bought Juices, Alcohol , Artificial Sweeteners,  Coffee Creamer, and Sugar-free Products, Lemonade. This will help patient to have more stable blood glucose profile and potentially avoid unintended weight gain.  The following Lifestyle Medicine recommendations according to American College of Lifestyle Medicine  [facility]) were discussed and and offered to patient and she  agrees to start the journey:  A. Whole Foods, Plant-Based Nutrition comprising of fruits and vegetables, plant-based proteins, whole-grain carbohydrates was discussed in detail with the patient.   A list for source of those nutrients were also provided to the patient.  Patient will use only water or unsweetened tea for hydration. B.  The need to stay away from risky substances including alcohol, smoking; obtaining 7 to 9 hours of restorative sleep, at least 150 minutes of moderate intensity exercise weekly, the importance of healthy social connections,  and stress management techniques were discussed. C.  A full color page of  Calorie density of various food groups per pound showing examples of each food groups was provided to the patient.   - I have approached her with the following plan to manage  her diabetes and patient agrees:   -In light of her presentation with target glycemic profile, she will not need prandial insulin for now.  She is advised to continue Lantus  20 units nightly, continue glipizide  5 mg p.o. daily at breakfast.  She is also benefiting from low-dose metformin-500 mg p.o. twice daily.    She is willing and advised to continue monitor blood glucose twice a day-daily before breakfast and at bedtime .  - she is encouraged to call clinic for blood glucose levels less than 70 or above 200 mg /dl. -She will  be considered for GLP-1 receptor agonist or SGLT2 inhibitors if she loses control.   - Specific targets for  A1c;  LDL, HDL,  and Triglycerides were  discussed with the patient.  2) Blood Pressure /Hypertension:   Her blood pressure is controlled to target.  she is advised to continue her current medications including Losartan 50 mg p.o. daily with breakfast .  3) Lipids/Hyperlipidemia:   Review of her recent lipid panel showed uncontrolled LDL increasing from 79 to 871.  She is given prescription for Crestor  10 mg p.o. nightly.  Side effects and precautions discussed with her.    Whole food plant-based diet is helping her achieve this.     4)  Weight/Diet:  Body mass index is 27.47 kg/m.  -Continues to lose weight intentionally.  She is a candidate for some more weight loss.  I discussed with her the fact that loss of 5 - 10% of her  current body weight will have the most impact on her diabetes management.  The above detailed  ACLM recommendations for nutrition, exercise, sleep, social life, avoidance of risky substances, the need for restorative sleep   information will also detailed on discharge instructions.  5) Hypothyroidism: Her previsit thyroid  function tests are consistent with appropriate replacement.  She is advised to continue levothyroxine  112 mcg p.o. daily before breakfast.     - We discussed about the correct intake of her thyroid  hormone, on empty stomach at fasting, with water, separated by at least 30 minutes from breakfast and other medications,  and separated by more than 4 hours from calcium , iron, multivitamins, acid reflux medications (PPIs). -Patient is made aware of the fact that thyroid  hormone replacement  is needed for life, dose to be adjusted by periodic monitoring of thyroid  function tests.   6) Osteoporosis: She is on alendronate  70 mg po weekly. She did not have recent bone density. She is advised to continue.  She is advised to request for a follow-up bone density during her annual physical with her PCP.     7) Chronic Care/Health Maintenance:  -she  is on ARB  medications and  is encouraged to initiate  and continue to follow up with Ophthalmology, Dentist,  Podiatrist at least yearly or according to recommendations, and advised to  stay away from smoking. I have recommended yearly flu vaccine and pneumonia vaccine at least every 5 years; moderate intensity exercise for up to 150 minutes weekly; and  sleep for 7- 9 hours a day.  Her diabetes foot exam was normal Oct 28, 2022 .  - she is  advised to maintain close follow up with Marchelle Clem Pitts, MD for primary care needs, as well as her other providers for optimal and coordinated care.   I spent  26  minutes in the care of the patient today including review of labs from CMP, Lipids, Thyroid  Function, Hematology (current and previous including abstractions from other facilities); face-to-face time discussing  her blood glucose readings/logs, discussing hypoglycemia and hyperglycemia episodes and symptoms, medications doses, her options of short and long term treatment based on the latest standards of care / guidelines;  discussion about incorporating lifestyle medicine;  and documenting the encounter. Risk reduction counseling performed per USPSTF guidelines to reduce cardiovascular risk factors.     Please refer to Patient Instructions for Blood Glucose Monitoring and Insulin/Medications Dosing Guide  in media tab for additional information. Please  also refer to  Patient Self Inventory in the Media  tab for reviewed elements of pertinent patient history.  Priscilla Bowers participated in the discussions, expressed understanding, and voiced agreement with the above plans.  All questions were answered to her satisfaction. she is encouraged to contact clinic should she have any questions or concerns prior to her return visit.   Follow up plan: - Return in about 9 weeks (around 08/29/2023) for Bring Meter/CGM Device/Logs- A1c in Office.  Ranny Earl, MD [facility] [address] [place], [place] [address] Phone: [phone]  Fax: [phone]    06/27/2023, 2:30 PM  This note was partially dictated with voice recognition software. Similar sounding words can be transcribed inadequately or may not  be corrected upon review.

## 2023-06-27 NOTE — Patient Instructions (Signed)

## 2023-08-25 LAB — COLOGUARD: COLOGUARD: NEGATIVE

## 2023-08-25 LAB — EXTERNAL GENERIC LAB PROCEDURE: COLOGUARD: NEGATIVE

## 2023-08-29 ENCOUNTER — Ambulatory Visit: Payer: Medicare Other | Admitting: "Endocrinology

## 2023-09-15 ENCOUNTER — Ambulatory Visit: Admitting: "Endocrinology

## 2023-09-19 ENCOUNTER — Telehealth: Payer: Self-pay | Admitting: "Endocrinology

## 2023-09-19 NOTE — Telephone Encounter (Signed)
 Pt called on 09/14/23 to reschedule appt on 09/15/23.  Have attempted to call pt on 09/15/23 and 09/19/23 to reschedule.  Can not leave message on the cell phone number and the house number just rings with no option to leave a message.

## 2023-10-17 ENCOUNTER — Other Ambulatory Visit: Payer: Self-pay | Admitting: "Endocrinology

## 2023-12-12 ENCOUNTER — Other Ambulatory Visit: Payer: Self-pay | Admitting: "Endocrinology

## 2024-01-01 ENCOUNTER — Other Ambulatory Visit: Payer: Self-pay | Admitting: "Endocrinology

## 2024-01-09 ENCOUNTER — Other Ambulatory Visit: Payer: Self-pay | Admitting: "Endocrinology

## 2024-01-15 ENCOUNTER — Telehealth: Payer: Self-pay | Admitting: "Endocrinology

## 2024-01-15 DIAGNOSIS — E1165 Type 2 diabetes mellitus with hyperglycemia: Secondary | ICD-10-CM

## 2024-01-15 MED ORDER — LANTUS SOLOSTAR 100 UNIT/ML ~~LOC~~ SOPN: 20.0000 [IU] | PEN_INJECTOR | Freq: Every day | SUBCUTANEOUS | 0 refills | Status: DC

## 2024-01-15 NOTE — Telephone Encounter (Signed)
 Priscilla Bowers is the one who Called [phone]

## 2024-01-15 NOTE — Telephone Encounter (Signed)
 Rx refill for Lantus  20 units SQ sent to [facility].

## 2024-01-15 NOTE — Telephone Encounter (Signed)
 Pt is out of insulin and has r/s an app tof raug 15th.  Pt [facility] Rxs at [facility] in [place], [place].

## 2024-02-02 ENCOUNTER — Encounter: Payer: Self-pay | Admitting: "Endocrinology

## 2024-02-02 ENCOUNTER — Ambulatory Visit (INDEPENDENT_AMBULATORY_CARE_PROVIDER_SITE_OTHER): Payer: Self-pay | Admitting: "Endocrinology

## 2024-02-02 VITALS — BP 132/80 | HR 92 | Ht 66.0 in | Wt 181.2 lb

## 2024-02-02 DIAGNOSIS — E039 Hypothyroidism, unspecified: Secondary | ICD-10-CM | POA: Diagnosis not present

## 2024-02-02 DIAGNOSIS — E1165 Type 2 diabetes mellitus with hyperglycemia: Secondary | ICD-10-CM | POA: Diagnosis not present

## 2024-02-02 DIAGNOSIS — E782 Mixed hyperlipidemia: Secondary | ICD-10-CM | POA: Diagnosis not present

## 2024-02-02 DIAGNOSIS — Z794 Long term (current) use of insulin: Secondary | ICD-10-CM | POA: Diagnosis not present

## 2024-02-02 DIAGNOSIS — I10 Essential (primary) hypertension: Secondary | ICD-10-CM | POA: Diagnosis not present

## 2024-02-02 LAB — POCT GLYCOSYLATED HEMOGLOBIN (HGB A1C): HbA1c, POC (controlled diabetic range): 10.7 % — AB (ref 0.0–7.0)

## 2024-02-02 MED ORDER — FREESTYLE LIBRE 2 READER DEVI: 0 refills | Status: AC

## 2024-02-02 MED ORDER — FREESTYLE LIBRE 2 PLUS SENSOR MISC: 2 refills | Status: AC

## 2024-02-02 MED ORDER — LANTUS SOLOSTAR 100 UNIT/ML ~~LOC~~ SOPN: 40.0000 [IU] | PEN_INJECTOR | Freq: Every day | SUBCUTANEOUS | 1 refills | Status: DC

## 2024-02-02 MED ORDER — MOUNJARO 2.5 MG/0.5ML ~~LOC~~ SOAJ: 2.5000 mg | SUBCUTANEOUS | 0 refills | Status: DC

## 2024-02-02 NOTE — Patient Instructions (Signed)

## 2024-02-02 NOTE — Progress Notes (Signed)
 02/02/2024, 1:21 PM  Endocrinology follow-up note   Subjective:    Patient ID: Priscilla Bowers, female    DOB: 01-Jan-1951.  Priscilla Bowers is being seen in follow-up after she was seen in consultation for management of currently uncontrolled symptomatic diabetes requested by  Marchelle Clem Pitts, MD.   Past Medical History:  Diagnosis Date   Asthma    Diabetes mellitus, type II (HCC)    Dyspnea    Hyperlipidemia    Hypertension    Hypothyroidism     Past Surgical History:  Procedure Laterality Date   EYE SURGERY     PARATHYROIDECTOMY Left 04/23/2018   Procedure: LEFT SUPERIOR AND INFERIOR PARATHYROIDECTOMY;  Surgeon: Eletha Boas, MD;  Location: [facility] OR;  Service: General;  Laterality: Left;   TONSILLECTOMY     TUBAL LIGATION      Social History   Socioeconomic History   Marital status: Unknown    Spouse name: Not on file   Number of children: Not on file   Years of education: Not on file   Highest education level: Not on file  Occupational History   Not on file  Tobacco Use   Smoking status: Never   Smokeless tobacco: Never  Vaping Use   Vaping status: Never Used  Substance and Sexual Activity   Alcohol use: No   Drug use: No   Sexual activity: Not on file  Other Topics Concern   Not on file  Social History Narrative   Not on file   Social Drivers of Health   Financial Resource Strain: Not on file  Food Insecurity: Not on file  Transportation Needs: Not on file  Physical Activity: Not on file  Stress: Not on file  Social Connections: Not on file    Family History  Problem Relation Age of Onset   Hypertension Mother    Diabetes Mother    Heart attack Mother    Cancer Mother     Outpatient Encounter Medications as of 02/02/2024  Medication Sig   Continuous Glucose Receiver (FREESTYLE LIBRE 2 READER) DEVI As directed   Continuous Glucose Sensor (FREESTYLE LIBRE 2 PLUS  SENSOR) MISC Change sensor every 15 days.   tirzepatide (MOUNJARO) 2.5 MG/0.5ML Pen Inject 2.5 mg into the skin once a week.   alendronate (FOSAMAX) 70 MG tablet TAKE ONE (1) TABLET BY MOUTH EVERY WEEK  WITH A FULL GLASS OF WATER ON AN EMPTY STOMACH    amLODipine (NORVASC) 5 MG tablet Take 5 mg by mouth daily.   Blood Glucose Monitoring Suppl (ACCU-CHEK GUIDE) w/Device KIT 1 Piece by Does not apply route as directed. (Patient not taking: Reported on 04/11/2018)   escitalopram (LEXAPRO) 10 MG tablet Take 10 mg by mouth daily.   glucose blood (ACCU-CHEK GUIDE) test strip Use as instructed (Patient not taking: Reported on 04/11/2018)   insulin glargine (LANTUS SOLOSTAR) 100 UNIT/ML Solostar Pen Inject 40 Units into the skin at bedtime.   levothyroxine (SYNTHROID) 112 MCG tablet TAKE ONE (1) TABLET (112 MCG TOTAL) BY MOUTH DAILY BEFORE BREAKFAST.   losartan (COZAAR) 50 MG tablet Take 50 mg by mouth daily.   meclizine (ANTIVERT)  25 MG tablet Take 25 mg by mouth 3 (three) times daily as needed for dizziness.    metFORMIN (GLUCOPHAGE) 500 MG tablet Take 500 mg by mouth 2 (two) times daily with a meal.   metoprolol tartrate (LOPRESSOR) 25 MG tablet Take 25 mg by mouth 2 (two) times daily.   omeprazole (PRILOSEC) 40 MG capsule Take 40 mg by mouth daily.   rosuvastatin (CRESTOR) 10 MG tablet Take 1 tablet (10 mg total) by mouth at bedtime.   Vitamin D, Ergocalciferol, (DRISDOL) 1.25 MG (50000 UT) CAPS capsule TAKE 1 CAPSULE BY MOUTH EVERY WEEK   [DISCONTINUED] glipiZIDE (GLUCOTROL XL) 5 MG 24 hr tablet TAKE ONE (1) TABLET (5 MG TOTAL) BY MOUTH DAILY WITH BREAKFAST.   [DISCONTINUED] insulin glargine (LANTUS SOLOSTAR) 100 UNIT/ML Solostar Pen Inject 20 Units into the skin at bedtime.   No facility-administered encounter medications on file as of 02/02/2024.    ALLERGIES: No Known Allergies  VACCINATION STATUS:  There is no immunization history on file for this patient.  Diabetes She presents for her  follow-up diabetic visit. She has type 2 diabetes mellitus. Onset time: she was diagnosed at approx age of 34 yrs. Her disease course has been improving (this patient was previously seen in consult for hyperparathyroidism, hypothyroidism, osteoporosis). There are no hypoglycemic associated symptoms. Pertinent negatives for hypoglycemia include no confusion, headaches, pallor or seizures. Associated symptoms include fatigue, polydipsia and polyuria. Pertinent negatives for diabetes include no chest pain and no polyphagia. There are no hypoglycemic complications. Symptoms are improving. Risk factors for coronary artery disease include hypertension, obesity, family history, sedentary lifestyle and post-menopausal. Current diabetic treatment includes insulin injections (she is on Lantus 13 units, glimepiride 4 mg po BID, and mtf 500 mg x 2). Her weight is increasing steadily. She is following a generally unhealthy diet. When asked about meal planning, she reported none. She has not had a previous visit with a dietitian. She never participates in exercise. Her home blood glucose trend is decreasing steadily. Her breakfast blood glucose range is generally >200 mg/dl. Her bedtime blood glucose range is generally >200 mg/dl. Her overall blood glucose range is >200 mg/dl. (Priscilla Bowers did bring her meter and logs.  Her meter average shows average blood glucose of 212-245.  Her point-of-care A1c is 10.7% improving from 13.9%.   She did not document or report hypoglycemia. ) An ACE inhibitor/angiotensin II receptor blocker is being taken.  Hypertension This is a chronic problem. The current episode started more than 1 year ago. The problem is controlled. Pertinent negatives include no chest pain, headaches, palpitations or shortness of breath. Risk factors for coronary artery disease include diabetes mellitus, obesity, sedentary lifestyle and post-menopausal state. Past treatments include ACE inhibitors.   Hyperlipidemia This is a chronic problem. The current episode started more than 1 year ago. Exacerbating diseases include diabetes and hypothyroidism. Pertinent negatives include no chest pain, myalgias or shortness of breath. Risk factors for coronary artery disease include dyslipidemia, diabetes mellitus, hypertension, obesity, a sedentary lifestyle and post-menopausal.      Objective:       02/02/2024   10:30 AM 06/27/2023    2:25 PM 06/27/2023    2:06 PM  Vitals with BMI  Height 5' 6  5' 6  Weight 181 lbs 3 oz  170 lbs 3 oz  BMI 29.26  27.48  Systolic 132 130 855  Diastolic 80 66 78  Pulse 92  88    BP 132/80   Pulse 92  Ht 5' 6 (1.676 m)   Wt 181 lb 3.2 oz (82.2 kg)   BMI 29.25 kg/m   Wt Readings from Last 3 Encounters:  02/02/24 181 lb 3.2 oz (82.2 kg)  06/27/23 170 lb 3.2 oz (77.2 kg)  05/22/23 164 lb 12.8 oz (74.8 kg)      CMP ( most recent) CMP     Component Value Date/Time   NA 135 (A) 04/20/2023 0000   K 4.5 04/20/2023 0000   CL 106 04/20/2023 0000   CO2 24 (A) 04/20/2023 0000   GLUCOSE 303 (H) 04/16/2018 1018   BUN 14 04/20/2023 0000   CREATININE 0.8 04/20/2023 0000   CREATININE 0.83 04/16/2018 1018   CALCIUM  8.9 04/20/2023 0000   ALBUMIN 3.5 04/20/2023 0000   AST 5 (A) 04/20/2023 0000   ALT 10 04/20/2023 0000   ALKPHOS 39 04/29/2022 0000   GFRNONAA >60 04/16/2018 1018   GFRAA >60 04/16/2018 1018     Diabetic Labs (most recent): Lab Results  Component Value Date   HGBA1C 10.7 (A) 02/02/2024   HGBA1C 13.9 (A) 05/22/2023   HGBA1C 6.9 10/26/2022     Lipid Panel ( most recent) Lipid Panel     Component Value Date/Time   CHOL 232 (A) 04/20/2023 0000   TRIG 302 (A) 04/20/2023 0000   HDL 44 04/20/2023 0000   LDLCALC 128 04/20/2023 0000      Lab Results  Component Value Date   TSH 1.95 04/20/2023   TSH 5.65 04/29/2022   TSH 2.41 06/08/2017      Assessment & Plan:   1. Type 2 diabetes mellitus with hyperglycemia, with long-term  current use of insulin (HCC)   - GAILYA TAUER has currently uncontrolled symptomatic type 2 DM since  73 years of age.  Priscilla Bowers did bring her meter and logs.  Her meter average shows average blood glucose of 212-245.  Her point-of-care A1c is 10.7% improving from 13.9%.   She did not document or report hypoglycemia.  Recent labs reviewed. - I had a long discussion with her about the progressive nature of diabetes and the pathology behind its complications. -her diabetes is complicated by comorbid conditions including HTN, HPL, sedentary life and she remains at a high risk for more acute and chronic complications which include CAD, CVA, CKD, retinopathy, and neuropathy. These are all discussed in detail with her.  - I discussed all available options of managing her diabetes including de-escalation of medications. I have counseled her on diet  and weight management  by adopting a Whole Food , Plant Predominant  ( WFPP) nutrition as recommended by [employer] of Lifestyle Medicine. Patient is encouraged to switch to  unprocessed or minimally processed  complex starch, adequate protein intake (mainly plant source), minimal liquid fat ( mainly vegetable oils), plenty of fruits, and vegetables. -  she is advised to stick to a routine mealtimes to eat 3 complete meals a day and snack only when necessary ( to snack only to correct hypoglycemia BG <70 day time or <100 at night).   She made several important changes in her lifestyle.  She is generating tremendous results on her diabetes, hyperlipidemia, and her weight.  - she acknowledges that there is a room for improvement in her food and drink choices. - Suggestion is made for her to avoid simple carbohydrates  from her diet including Cakes, Sweet Desserts, Ice Cream, Soda (diet and regular), Sweet Tea, Candies, Chips, Cookies, Store Bought Juices, Alcohol , Artificial  Sweeteners,  Coffee Creamer, and Sugar-free Products, Lemonade. This will  help patient to have more stable blood glucose profile and potentially avoid unintended weight gain.   - I have approached her with the following plan to manage  her diabetes and patient agrees:   -In light of her presentation with significantly above target glycemic profile, she is advised to increase and continue Lantus at 40 units nightly.  She is encouraged to monitor blood glucose at least twice a day-daily before breakfast and at bedtime.  She will also benefit from a CGM.  I discussed and prescribed the freestyle libre device for her. She will benefit from GLP-1 receptor agonist.  I discussed and prescribed Mounjaro 2.5 mg subcutaneously weekly.  This medication will be advanced as she tolerates. At this time, she is advised to discontinue glipizide.  Advised to continue metformin 500 mg p.o. twice daily.  - she is encouraged to call clinic for blood glucose levels less than 70 or above 200 mg /dl.   - Specific targets for  A1c;  LDL, HDL,  and Triglycerides were discussed with the patient.  2) Blood Pressure /Hypertension:   -Her blood pressure is controlled to target.  she is advised to continue her current medications including Losartan 50 mg p.o. daily with breakfast .  3) Lipids/Hyperlipidemia:   Review of her recent lipid panel showed uncontrolled LDL increasing from 79 to 871.  She is advised to continue Crestor 10 mg p.o. daily at bedtime.  Side effects and precautions discussed with her.  Whole food plant-based diet is helping her achieve this.     4)  Weight/Diet:  Body mass index is 29.25 kg/m.  -Continues to lose weight intentionally.  She is a candidate for some more weight loss.  I discussed with her the fact that loss of 5 - 10% of her  current body weight will have the most impact on her diabetes management.  The above detailed  AC[facility] recommendations for nutrition, exercise, sleep, social life, avoidance of risky substances, the need for restorative sleep   information  will also detailed on discharge instructions.  5) Hypothyroidism: Her previsit thyroid function tests are consistent with appropriate replacement.  She is advised to continue levothyroxine 112 mcg p.o. daily before breakfast.    - We discussed about the correct intake of her thyroid hormone, on empty stomach at fasting, with water, separated by at least 30 minutes from breakfast and other medications,  and separated by more than 4 hours from calcium, iron, multivitamins, acid reflux medications (PPIs). -Patient is made aware of the fact that thyroid hormone replacement is needed for life, dose to be adjusted by periodic monitoring of thyroid function tests.   6) Osteoporosis: She is on alendronate 70 mg po weekly. She did not have recent bone density. She is advised to continue.  She is advised to request for a follow-up bone density during her annual physical with her PCP.     7) Chronic Care/Health Maintenance:  -she  is on ARB  medications and  is encouraged to initiate and continue to follow up with Ophthalmology, Dentist,  Podiatrist at least yearly or according to recommendations, and advised to  stay away from smoking. I have recommended yearly flu vaccine and pneumonia vaccine at least every 5 years; moderate intensity exercise for up to 150 minutes weekly; and  sleep for 7- 9 hours a day.  Her diabetes foot exam was normal Oct 28, 2022 .  - she is  advised to maintain close follow up with Marchelle Clem Pitts, MD for primary care needs, as well as her other providers for optimal and coordinated care.   I spent  41  minutes in the care of the patient today including review of labs from CMP, Lipids, Thyroid Function, Hematology (current and previous including abstractions from other facilities); face-to-face time discussing  her blood glucose readings/logs, discussing hypoglycemia and hyperglycemia episodes and symptoms, medications doses, her options of short and long term treatment  based on the latest standards of care / guidelines;  discussion about incorporating lifestyle medicine;  and documenting the encounter. Risk reduction counseling performed per USPSTF guidelines to reduce  cardiovascular risk factors.     Please refer to Patient Instructions for Blood Glucose Monitoring and Insulin/Medications Dosing Guide  in media tab for additional information. Please  also refer to  Patient Self Inventory in the Media  tab for reviewed elements of pertinent patient history.  Priscilla Bowers participated in the discussions, expressed understanding, and voiced agreement with the above plans.  All questions were answered to her satisfaction. she is encouraged to contact clinic should she have any questions or concerns prior to her return visit.   Follow up plan: - Return in about 3 months (around 05/04/2024) for F/U with Pre-visit Labs, Meter/CGM/Logs, A1c here.  Ranny Earl, MD [facility] [address] [place], [place] [address] Phone: [phone]  Fax: [phone]    02/02/2024, 1:21 PM  This note was partially dictated with voice recognition software. Similar sounding words can be transcribed inadequately or may not  be corrected upon review.

## 2024-03-08 ENCOUNTER — Other Ambulatory Visit: Payer: Self-pay | Admitting: "Endocrinology

## 2024-03-08 DIAGNOSIS — E1165 Type 2 diabetes mellitus with hyperglycemia: Secondary | ICD-10-CM

## 2024-03-26 ENCOUNTER — Telehealth: Payer: Self-pay

## 2024-03-26 DIAGNOSIS — E1165 Type 2 diabetes mellitus with hyperglycemia: Secondary | ICD-10-CM

## 2024-03-26 NOTE — Telephone Encounter (Signed)
 Pt called with high BG readings.   Date Before breakfast Before lunch Before supper Bedtime  10/4 197   70  10/5 163   459  10/6 164   187  10/7 201

## 2024-03-26 NOTE — Telephone Encounter (Signed)
 Called pt to obtain BG readings. Pt stated she did not have her glucometer with her at the time of the call. Requested pt to call the office back once she had her glucometer with her. Pt voiced understanding.

## 2024-03-27 MED ORDER — MOUNJARO 2.5 MG/0.5ML ~~LOC~~ SOAJ: 2.5000 mg | SUBCUTANEOUS | 2 refills | Status: DC

## 2024-03-27 NOTE — Telephone Encounter (Signed)
 Spoke with pt advising her Dr.Nida would like for to start Mounjaro  2.5mg  weakly if her insurance will cover. Pt voiced understanding. Rx for Mounjaro  2.5mg  weekly sent to [facility]

## 2024-03-27 NOTE — Telephone Encounter (Signed)
 Spoke with pt advised her no changes per Dr.Nida orders. Pt states she did not get the Mounjaro  2.5mg . States she did not know a Rx had been sent.

## 2024-03-27 NOTE — Addendum Note (Signed)
 Addended by: CLAUDENE ZADA POUR on: 03/27/2024 02:26 PM   Modules accepted: Orders

## 2024-05-13 ENCOUNTER — Ambulatory Visit: Admitting: "Endocrinology

## 2024-05-30 HISTORY — PX: ESOPHAGOGASTRODUODENOSCOPY: SHX1529

## 2024-06-12 LAB — COMPREHENSIVE METABOLIC PANEL WITH GFR
ALT: 6 IU/L (ref 0–32)
AST: 8 IU/L (ref 0–40)
Albumin: 4 g/dL (ref 3.8–4.8)
Alkaline Phosphatase: 63 IU/L (ref 49–135)
BUN/Creatinine Ratio: 18 (ref 12–28)
BUN: 17 mg/dL (ref 8–27)
Bilirubin Total: 0.3 mg/dL (ref 0.0–1.2)
CO2: 21 mmol/L (ref 20–29)
Calcium: 9.1 mg/dL (ref 8.7–10.3)
Chloride: 100 mmol/L (ref 96–106)
Creatinine, Ser: 0.92 mg/dL (ref 0.57–1.00)
Globulin, Total: 2.6 g/dL (ref 1.5–4.5)
Glucose: 328 mg/dL — ABNORMAL HIGH (ref 70–99)
Potassium: 4.8 mmol/L (ref 3.5–5.2)
Sodium: 132 mmol/L — ABNORMAL LOW (ref 134–144)
Total Protein: 6.6 g/dL (ref 6.0–8.5)
eGFR: 66 mL/min/1.73

## 2024-06-12 LAB — LIPID PANEL
Chol/HDL Ratio: 7.4 ratio — ABNORMAL HIGH (ref 0.0–4.4)
Cholesterol, Total: 216 mg/dL — ABNORMAL HIGH (ref 100–199)
HDL: 29 mg/dL — ABNORMAL LOW
LDL Chol Calc (NIH): 107 mg/dL — ABNORMAL HIGH (ref 0–99)
Triglycerides: 464 mg/dL — ABNORMAL HIGH (ref 0–149)
VLDL Cholesterol Cal: 80 mg/dL — ABNORMAL HIGH (ref 5–40)

## 2024-06-12 LAB — FIB-4 W/REFLEX TO ELF
FIB-4 Index: 0.67 (ref 0.00–2.67)
Platelets: 353 x10E3/uL (ref 150–450)

## 2024-06-12 LAB — TSH: TSH: 1.53 u[IU]/mL (ref 0.450–4.500)

## 2024-06-12 LAB — T4, FREE: Free T4: 1.09 ng/dL (ref 0.82–1.77)

## 2024-06-25 ENCOUNTER — Encounter: Payer: Self-pay | Admitting: "Endocrinology

## 2024-06-25 ENCOUNTER — Ambulatory Visit (INDEPENDENT_AMBULATORY_CARE_PROVIDER_SITE_OTHER): Admitting: "Endocrinology

## 2024-06-25 VITALS — BP 132/80 | HR 92 | Ht 66.0 in | Wt 182.2 lb

## 2024-06-25 DIAGNOSIS — Z91199 Patient's noncompliance with other medical treatment and regimen due to unspecified reason: Secondary | ICD-10-CM | POA: Insufficient documentation

## 2024-06-25 DIAGNOSIS — Z794 Long term (current) use of insulin: Secondary | ICD-10-CM | POA: Diagnosis not present

## 2024-06-25 DIAGNOSIS — E1165 Type 2 diabetes mellitus with hyperglycemia: Secondary | ICD-10-CM | POA: Diagnosis not present

## 2024-06-25 DIAGNOSIS — E782 Mixed hyperlipidemia: Secondary | ICD-10-CM

## 2024-06-25 DIAGNOSIS — E039 Hypothyroidism, unspecified: Secondary | ICD-10-CM | POA: Diagnosis not present

## 2024-06-25 DIAGNOSIS — I10 Essential (primary) hypertension: Secondary | ICD-10-CM | POA: Diagnosis not present

## 2024-06-25 LAB — POCT GLYCOSYLATED HEMOGLOBIN (HGB A1C): HbA1c, POC (controlled diabetic range): 12.7 % — AB (ref 0.0–7.0)

## 2024-06-25 MED ORDER — LANTUS SOLOSTAR 100 UNIT/ML ~~LOC~~ SOPN: 50.0000 [IU] | PEN_INJECTOR | Freq: Every day | SUBCUTANEOUS | 1 refills | Status: DC

## 2024-06-25 MED ORDER — MOUNJARO 2.5 MG/0.5ML ~~LOC~~ SOAJ: 2.5000 mg | SUBCUTANEOUS | 0 refills | Status: AC

## 2024-06-25 NOTE — Patient Instructions (Signed)

## 2024-06-25 NOTE — Progress Notes (Signed)
 "                                                                              06/25/2024, 2:48 PM  Endocrinology follow-up note   Subjective:    Priscilla Bowers ID: Priscilla Bowers, female    DOB: 1950-08-27.  Priscilla Bowers Bowers being seen in follow-up after Priscilla Bowers was seen in consultation for management of currently uncontrolled symptomatic diabetes requested by  Marchelle Clem Pitts, MD.   Past Medical History:  Diagnosis Date   Asthma    Diabetes mellitus, type II (HCC)    Dyspnea    Hyperlipidemia    Hypertension    Hypothyroidism     Past Surgical History:  Procedure Laterality Date   ESOPHAGEAL DILATION     ESOPHAGOGASTRODUODENOSCOPY  05/30/2024   EYE SURGERY     PARATHYROIDECTOMY Left 04/23/2018   Procedure: LEFT SUPERIOR AND INFERIOR PARATHYROIDECTOMY;  Surgeon: Eletha Boas, MD;  Location: [facility] OR;  Service: General;  Laterality: Left;   TONSILLECTOMY     TUBAL LIGATION      Social History   Socioeconomic History   Marital status: Unknown    Spouse name: Not on file   Number of children: Not on file   Years of education: Not on file   Highest education level: Not on file  Occupational History   Not on file  Tobacco Use   Smoking status: Never   Smokeless tobacco: Never  Vaping Use   Vaping status: Never Used  Substance and Sexual Activity   Alcohol use: No   Drug use: No   Sexual activity: Not on file  Other Topics Concern   Not on file  Social History Narrative   Not on file   Social Drivers of Health   Tobacco Use: Low Risk (06/25/2024)   Priscilla Bowers History    Smoking Tobacco Use: Never    Smokeless Tobacco Use: Never    Passive Exposure: Not on file  Financial Resource Strain: Not on file  Food Insecurity: Not on file  Transportation Needs: Not on file  Physical Activity: Not on file  Stress: Not on file  Social Connections: Not on file  Depression (EYV7-0): Not on file  Alcohol Screen: Not on file  Housing: Not on file  Utilities: Not on file   Health Literacy: Not on file    Family History  Problem Relation Age of Onset   Hypertension Mother    Diabetes Mother    Heart attack Mother    Cancer Mother     Outpatient Encounter Medications as of 06/25/2024  Medication Sig   alendronate  (FOSAMAX ) 70 MG tablet TAKE ONE (1) TABLET BY MOUTH EVERY WEEK  WITH A FULL GLASS OF WATER ON AN EMPTY STOMACH    amLODipine (NORVASC) 5 MG tablet Take 5 mg by mouth daily.   Blood Glucose Monitoring Suppl (ACCU-CHEK GUIDE) w/Device KIT 1 Piece by Does not apply route as directed. (Priscilla Bowers not taking: Reported on 04/11/2018)   Continuous Glucose Receiver (FREESTYLE LIBRE 2 READER) DEVI As directed   Continuous Glucose Sensor (FREESTYLE LIBRE 2 PLUS SENSOR) MISC Change sensor every 15 days.   escitalopram (LEXAPRO) 10 MG tablet Take 10 mg  by mouth daily.   glucose blood (ACCU-CHEK GUIDE) test strip Use as instructed (Priscilla Bowers not taking: Reported on 04/11/2018)   insulin glargine  (LANTUS  SOLOSTAR) 100 UNIT/ML Solostar Pen Inject 50 Units into the skin at bedtime.   levothyroxine  (SYNTHROID ) 112 MCG tablet TAKE ONE (1) TABLET (112 MCG TOTAL) BY MOUTH DAILY BEFORE BREAKFAST.   losartan (COZAAR) 50 MG tablet Take 50 mg by mouth daily.   meclizine (ANTIVERT) 25 MG tablet Take 25 mg by mouth 3 (three) times daily as needed for dizziness.    metFORMIN (GLUCOPHAGE) 500 MG tablet Take 500 mg by mouth 2 (two) times daily with a meal.   metoprolol tartrate (LOPRESSOR) 25 MG tablet Take 25 mg by mouth 2 (two) times daily.   omeprazole (PRILOSEC) 40 MG capsule Take 40 mg by mouth daily.   rosuvastatin  (CRESTOR ) 10 MG tablet Take 1 tablet (10 mg total) by mouth at bedtime.   tirzepatide  (MOUNJARO ) 2.5 MG/0.5ML Pen Inject 2.5 mg into the skin once a week.   Vitamin D , Ergocalciferol , (DRISDOL ) 1.25 MG (50000 UT) CAPS capsule TAKE 1 CAPSULE BY MOUTH EVERY WEEK   [DISCONTINUED] insulin glargine  (LANTUS  SOLOSTAR) 100 UNIT/ML Solostar Pen Inject 40 Units into the skin  at bedtime.   [DISCONTINUED] tirzepatide  (MOUNJARO ) 2.5 MG/0.5ML Pen Inject 2.5 mg into the skin once a week.   No facility-administered encounter medications on file as of 06/25/2024.    ALLERGIES: No Known Allergies  VACCINATION STATUS:  There Bowers no immunization history on file for this Priscilla Bowers.  Diabetes Priscilla Bowers presents for Priscilla Bowers follow-up diabetic visit. Priscilla Bowers has type 2 diabetes mellitus. Onset time: Priscilla Bowers was diagnosed at approx age of 29 yrs. Priscilla Bowers disease course has been worsening (this Priscilla Bowers was previously seen in consult for hyperparathyroidism, hypothyroidism, osteoporosis). There are no hypoglycemic associated symptoms. Pertinent negatives for hypoglycemia include no confusion, headaches, pallor or seizures. Associated symptoms include fatigue, polydipsia and polyuria. Pertinent negatives for diabetes include no chest pain and no polyphagia. There are no hypoglycemic complications. Symptoms are worsening. Risk factors for coronary artery disease include hypertension, obesity, family history, sedentary lifestyle and post-menopausal. Current diabetic treatment includes insulin injections (Priscilla Bowers on Lantus  13 units, glimepiride 4 mg po BID, and mtf 500 mg x 2). Priscilla Bowers weight Bowers fluctuating minimally. Priscilla Bowers following a generally unhealthy diet. When asked about meal planning, Priscilla Bowers reported none. Priscilla Bowers has not had a previous visit with a dietitian. Priscilla Bowers never participates in exercise. Priscilla Bowers home blood glucose trend Bowers increasing steadily. Priscilla Bowers bedtime blood glucose range Bowers generally >200 mg/dl. Priscilla Bowers overall blood glucose range Bowers >200 mg/dl. (Priscilla Bowers brought in Priscilla Bowers meter showing consistently above target glycemic profile above 200 mg/day.  Priscilla Bowers point-of-care A1c Bowers 12.7% increasing from 10.7% during Priscilla Bowers last visit.  Priscilla Bowers did not stay on the prescribed dose of Lantus , did not start the Mounjaro  prescribed for Priscilla Bowers during last visit.  Priscilla Bowers did not start using freestyle libre CGM device given to Priscilla Bowers, brought in the  device for demonstration.  Priscilla Bowers did not document or report hypoglycemia. ) An ACE inhibitor/angiotensin II receptor blocker Bowers being taken.  Hypertension This Bowers a chronic problem. The current episode started more than 1 year ago. The problem Bowers controlled. Pertinent negatives include no chest pain, headaches, palpitations or shortness of breath. Risk factors for coronary artery disease include diabetes mellitus, obesity, sedentary lifestyle and post-menopausal state. Past treatments include ACE inhibitors.  Hyperlipidemia This Bowers a chronic problem. The current episode started more than 1 year  ago. Exacerbating diseases include diabetes and hypothyroidism. Pertinent negatives include no chest pain, myalgias or shortness of breath. Risk factors for coronary artery disease include dyslipidemia, diabetes mellitus, hypertension, obesity, a sedentary lifestyle and post-menopausal.      Objective:       06/25/2024    1:34 PM 02/02/2024   10:30 AM 06/27/2023    2:25 PM  Vitals with BMI  Height 5' 6 5' 6   Weight 182 lbs 3 oz 181 lbs 3 oz   BMI 29.42 29.26   Systolic 132 132 869  Diastolic 80 80 66  Pulse 92 92     BP 132/80   Pulse 92   Ht 5' 6 (1.676 m)   Wt 182 lb 3.2 oz (82.6 kg)   BMI 29.41 kg/m   Wt Readings from Last 3 Encounters:  06/25/24 182 lb 3.2 oz (82.6 kg)  02/02/24 181 lb 3.2 oz (82.2 kg)  06/27/23 170 lb 3.2 oz (77.2 kg)      CMP ( most recent) CMP     Component Value Date/Time   NA 132 (L) 06/11/2024 0909   K 4.8 06/11/2024 0909   CL 100 06/11/2024 0909   CO2 21 06/11/2024 0909   GLUCOSE 328 (H) 06/11/2024 0909   GLUCOSE 303 (H) 04/16/2018 1018   BUN 17 06/11/2024 0909   CREATININE 0.92 06/11/2024 0909   CALCIUM  9.1 06/11/2024 0909   PROT 6.6 06/11/2024 0909   ALBUMIN 4.0 06/11/2024 0909   AST 8 06/11/2024 0909   ALT 6 06/11/2024 0909   ALKPHOS 63 06/11/2024 0909   BILITOT 0.3 06/11/2024 0909   GFRNONAA >60 04/16/2018 1018   GFRAA >60 04/16/2018 1018      Diabetic Labs (most recent): Lab Results  Component Value Date   HGBA1C 12.7 (A) 06/25/2024   HGBA1C 10.7 (A) 02/02/2024   HGBA1C 13.9 (A) 05/22/2023     Lipid Panel ( most recent) Lipid Panel     Component Value Date/Time   CHOL 216 (H) 06/11/2024 0909   TRIG 464 (H) 06/11/2024 0909   HDL 29 (L) 06/11/2024 0909   CHOLHDL 7.4 (H) 06/11/2024 0909   LDLCALC 107 (H) 06/11/2024 0909   LABVLDL 80 (H) 06/11/2024 0909      Lab Results  Component Value Date   TSH 1.530 06/11/2024   TSH 1.95 04/20/2023   TSH 5.65 04/29/2022   TSH 2.41 06/08/2017   FREET4 1.09 06/11/2024      Assessment & Plan:   1. Type 2 diabetes mellitus with hyperglycemia, with long-term current use of insulin (HCC)   - Priscilla Bowers has currently uncontrolled symptomatic type 2 DM since  74 years of age.  Priscilla Bowers brought in Priscilla Bowers meter showing consistently above target glycemic profile above 200 mg/day.  Priscilla Bowers point-of-care A1c Bowers 12.7% increasing from 10.7% during Priscilla Bowers last visit.  Priscilla Bowers did not stay on the prescribed dose of Lantus , did not start the Mounjaro  prescribed for Priscilla Bowers during last visit.  Priscilla Bowers did not start using freestyle libre CGM device given to Priscilla Bowers, brought in the device for demonstration.  Priscilla Bowers did not document or report hypoglycemia.  Recent labs reviewed. - I had a long discussion with Priscilla Bowers about the progressive nature of diabetes and the pathology behind its complications. -Priscilla Bowers diabetes Bowers complicated by comorbid conditions including HTN, HPL, sedentary life and Priscilla Bowers remains at a high risk for more acute and chronic complications which include CAD, CVA, CKD, retinopathy, and neuropathy. These are all  discussed in detail with Priscilla Bowers.  - I discussed all available options of managing Priscilla Bowers diabetes including de-escalation of medications. I have counseled Priscilla Bowers on diet  and weight management  by adopting a Whole Food , Plant Predominant  ( WFPP) nutrition as recommended by [employer] of  Lifestyle Medicine. Priscilla Bowers Bowers encouraged to switch to  unprocessed or minimally processed  complex starch, adequate protein intake (mainly plant source), minimal liquid fat ( mainly vegetable oils), plenty of fruits, and vegetables. -  Priscilla Bowers advised to stick to a routine mealtimes to eat 3 complete meals a day and snack only when necessary ( to snack only to correct hypoglycemia BG <70 day time or <100 at night).   - Priscilla Bowers acknowledges that there Bowers a room for improvement in Priscilla Bowers food and drink choices. - Suggestion Bowers made for Priscilla Bowers to avoid simple carbohydrates  from Priscilla Bowers diet including Cakes, Sweet Desserts, Ice Cream, Soda (diet and regular), Sweet Tea, Candies, Chips, Cookies, Store Bought Juices, Alcohol , Artificial Sweeteners,  Coffee Creamer, and Sugar-free Products, Lemonade. This Bowers help Priscilla Bowers to have more stable blood glucose profile and potentially avoid unintended weight gain.   - I have approached Priscilla Bowers with the following plan to manage  Priscilla Bowers diabetes and Priscilla Bowers agrees:   -In light of Priscilla Bowers presentation with significantly above target glycemic profile, Priscilla Bowers advised to increase Priscilla Bowers Lantus  to 50 units nightly.  Priscilla Bowers be assisted on placement of Priscilla Bowers first CGM device.   Priscilla Bowers benefit from GLP-1 receptor agonist.  Priscilla Bowers advised to start Mounjaro  2.5 mg subcutaneous weekly.   This medication Bowers be advanced as Priscilla Bowers tolerates. - Priscilla Bowers advised to continue metformin 500 mg p.o. twice daily.    - Priscilla Bowers encouraged to call clinic for blood glucose levels less than 70 or above 200 mg /dl.   - Specific targets for  A1c;  LDL, HDL,  and Triglycerides were discussed with the Priscilla Bowers.  2) Blood Pressure /Hypertension:   Priscilla Bowers blood pressure Bowers controlled currently.  Priscilla Bowers advised to continue Priscilla Bowers current medications including Losartan 50 mg p.o. daily with breakfast .  3) Lipids/Hyperlipidemia:   Review of Priscilla Bowers recent lipid panel showed uncontrolled LDL at 107.  Priscilla Bowers advised to continue  Crestor  10 mg p.o. daily at bedtime.  Side effects and precautions discussed with Priscilla Bowers.  Whole food plant-based diet Bowers helping Priscilla Bowers achieve this.     4)  Weight/Diet:  Body mass index Bowers 29.41 kg/m.  -Continues to lose weight intentionally.  Priscilla Bowers a candidate for some more weight loss.  I discussed with Priscilla Bowers the fact that loss of 5 - 10% of Priscilla Bowers  current body weight Bowers have the most impact on Priscilla Bowers diabetes management.  The above detailed  AC[facility] recommendations for nutrition, exercise, sleep, social life, avoidance of risky substances, the need for restorative sleep   information Bowers also detailed on discharge instructions.  5) Hypothyroidism: Priscilla Bowers previsit thyroid  function tests are consistent with appropriate replacement.  Priscilla Bowers advised to continue levothyroxine  112 mcg p.o. daily before breakfast.     - We discussed about the correct intake of Priscilla Bowers thyroid  hormone, on empty stomach at fasting, with water, separated by at least 30 minutes from breakfast and other medications,  and separated by more than 4 hours from calcium , iron, multivitamins, acid reflux medications (PPIs). -Priscilla Bowers Bowers made aware of the fact that thyroid  hormone replacement Bowers needed for life, dose to be adjusted  by periodic monitoring of thyroid  function tests.    6) Osteoporosis: Priscilla Bowers on alendronate  70 mg po weekly. Priscilla Bowers did not have recent bone density. Priscilla Bowers advised to continue.  Priscilla Bowers advised to request for a follow-up bone density during Priscilla Bowers annual physical with Priscilla Bowers PCP.     7) Chronic Care/Health Maintenance:  -Priscilla Bowers  Bowers on [facility]B  medications and  Bowers encouraged to initiate and continue to follow up with Ophthalmology, Dentist,  Podiatrist at least yearly or according to recommendations, and advised to  stay away from smoking. I have recommended yearly flu vaccine and pneumonia vaccine at least every 5 years; moderate intensity exercise for up to 150 minutes weekly; and  sleep for 7- 9 hours a day.  Priscilla Bowers diabetes foot  exam was normal Oct 28, 2022 .  - Priscilla Bowers  advised to maintain close follow up with Marchelle Clem Pitts, MD for primary care needs, as well as Priscilla Bowers other providers for optimal and coordinated care.  I spent  42  minutes in the care of the Priscilla Bowers today including review of labs from CMP, Lipids, Thyroid  Function, Hematology (current and previous including abstractions from other facilities); face-to-face time discussing  Priscilla Bowers blood glucose readings/logs, discussing hypoglycemia and hyperglycemia episodes and symptoms, medications doses, Priscilla Bowers options of short and long term treatment based on the latest standards of care / guidelines;  discussion about incorporating lifestyle medicine;  and documenting the encounter. Risk reduction counseling performed per USPSTF guidelines to reduce cardiovascular risk factors.     Please refer to Priscilla Bowers Instructions for Blood Glucose Monitoring and Insulin/Medications Dosing Guide  in media tab for additional information. Please  also refer to  Priscilla Bowers Self Inventory in the Media  tab for reviewed elements of pertinent Priscilla Bowers history.  Priscilla Bowers participated in the discussions, expressed understanding, and voiced agreement with the above plans.  All questions were answered to Priscilla Bowers satisfaction. Priscilla Bowers encouraged to contact clinic should Priscilla Bowers have any questions or concerns prior to Priscilla Bowers return visit. Dear Priscilla Bowers: Feel free to review your progress notes.  If you are reviewing this progress note and have questions about the meaning of /or medical terms being used, please make a note and address it at your next follow-up appointment.  Medical notes are meant to be a communication tool between medical professionals and require medical terms to be used for efficiency and insurance approval.   Follow up plan: - Return in about 3 months (around 09/23/2024) for Bring Meter/CGM Device/Logs- A1c in Office.  Ranny Earl, MD [facility] [address] [place], [place] [address] Phone: [phone]  Fax: [phone]    06/25/2024, 2:48 PM  This note was partially dictated with voice recognition software. Similar sounding words can be transcribed inadequately or may not  be corrected upon review.   "

## 2024-07-18 ENCOUNTER — Other Ambulatory Visit: Payer: Self-pay | Admitting: "Endocrinology

## 2024-07-18 DIAGNOSIS — E1165 Type 2 diabetes mellitus with hyperglycemia: Secondary | ICD-10-CM

## 2024-09-24 ENCOUNTER — Ambulatory Visit: Admitting: "Endocrinology
